# Patient Record
Sex: Male | Born: 1985 | Race: White | Hispanic: No | Marital: Single | Smoking: Never smoker
Health system: Southern US, Community
[De-identification: ages and names within clinical notes are randomized; demographics above are authoritative.]

## PROBLEM LIST (undated history)

## (undated) DIAGNOSIS — E7800 Pure hypercholesterolemia, unspecified: Secondary | ICD-10-CM

## (undated) HISTORY — PX: REFRACTIVE SURGERY: SHX103

---

## 2013-09-14 ENCOUNTER — Other Ambulatory Visit: Payer: Self-pay | Admitting: Internal Medicine

## 2013-09-14 DIAGNOSIS — E010 Iodine-deficiency related diffuse (endemic) goiter: Secondary | ICD-10-CM

## 2013-09-19 ENCOUNTER — Ambulatory Visit
Admission: RE | Admit: 2013-09-19 | Discharge: 2013-09-19 | Disposition: A | Discharge status: Home / Self Care | Payer: BC Managed Care – PPO | Source: Ambulatory Visit | Attending: Internal Medicine | Admitting: Internal Medicine

## 2013-09-19 DIAGNOSIS — E010 Iodine-deficiency related diffuse (endemic) goiter: Secondary | ICD-10-CM

## 2014-11-29 IMAGING — US US SOFT TISSUE HEAD/NECK
1 series · 14 of 25 positions shown · non-contrast
Comparison: None.

CLINICAL DATA: Thyromegaly.

EXAM:
THYROID ULTRASOUND
TECHNIQUE: Ultrasound examination of the thyroid gland and adjacent soft
tissues was performed.

[Series 1: us soft tissue head/neck · 0.08mm/px · 14 of 28 slices shown]
[im 1/28]
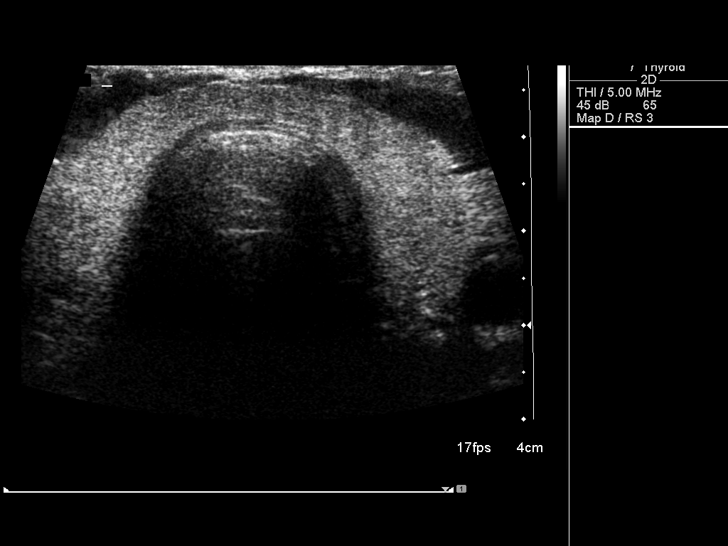
[im 3/28]
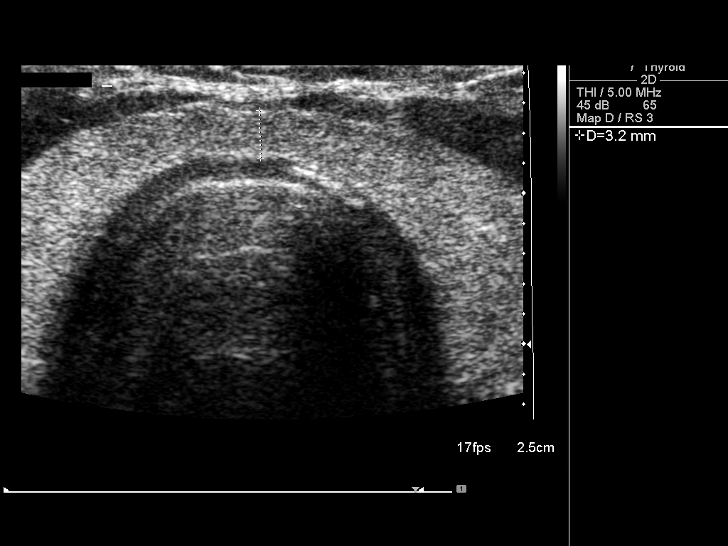
[im 5/28]
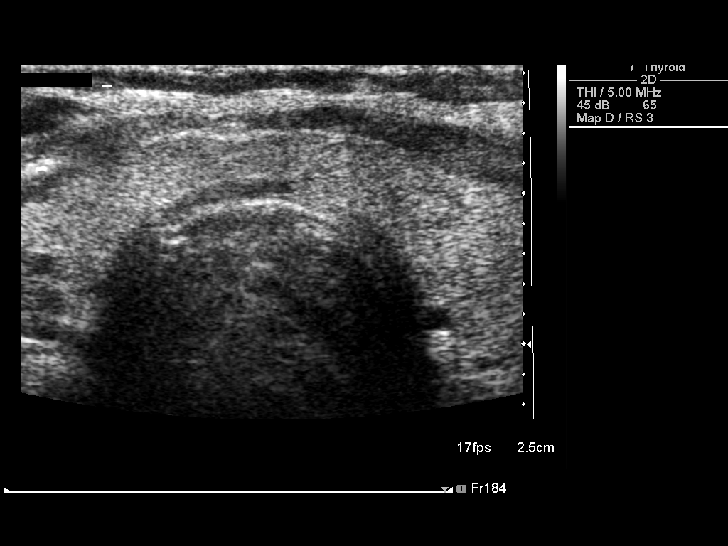
[im 7/28]
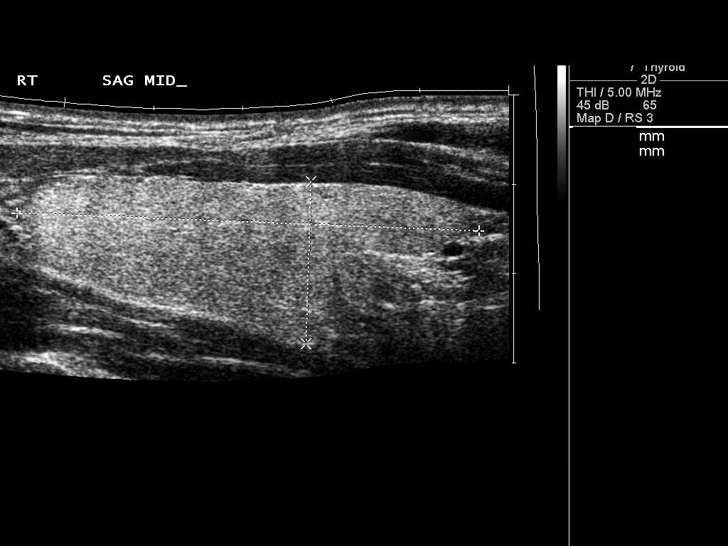
[im 10/28]
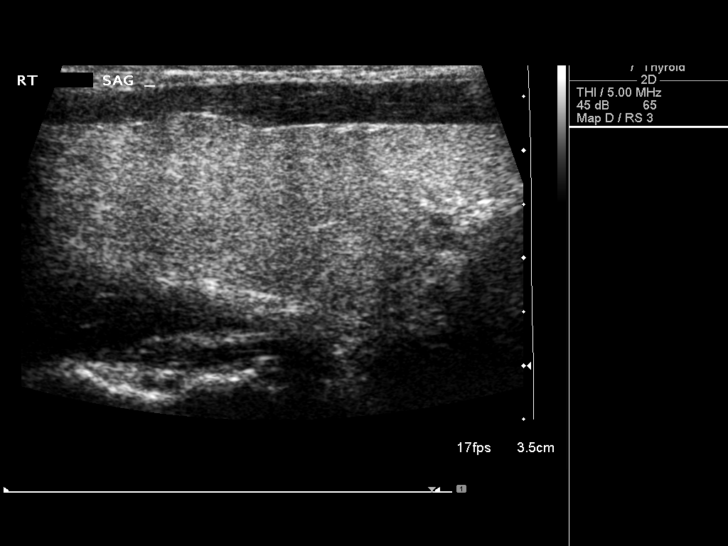
[im 11/28]
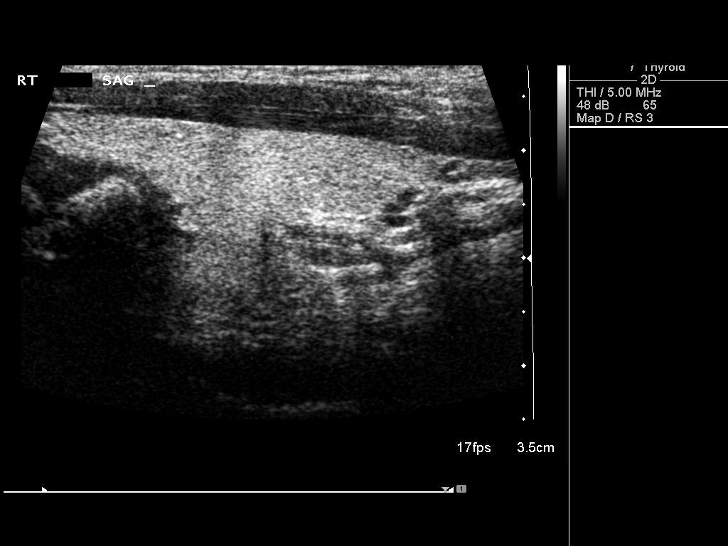
[im 13/28]
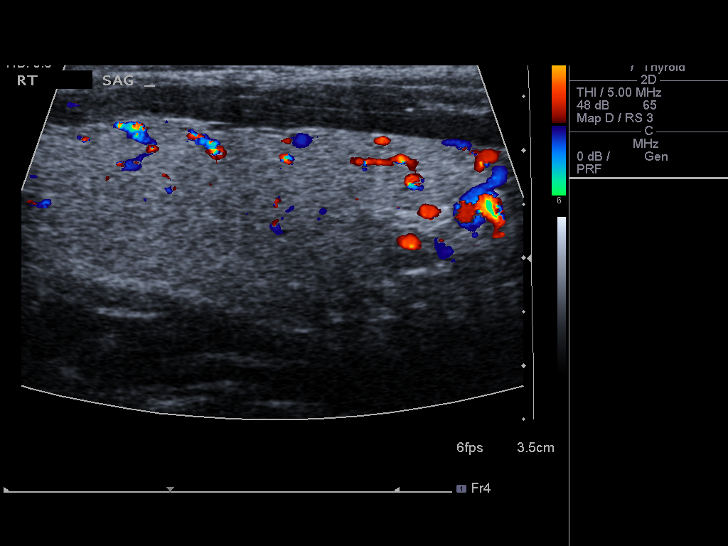
[im 15/28]
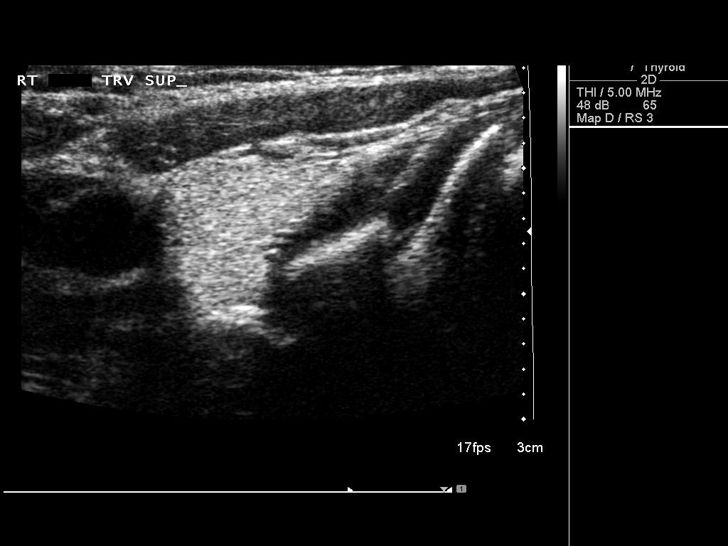
[im 17/28]
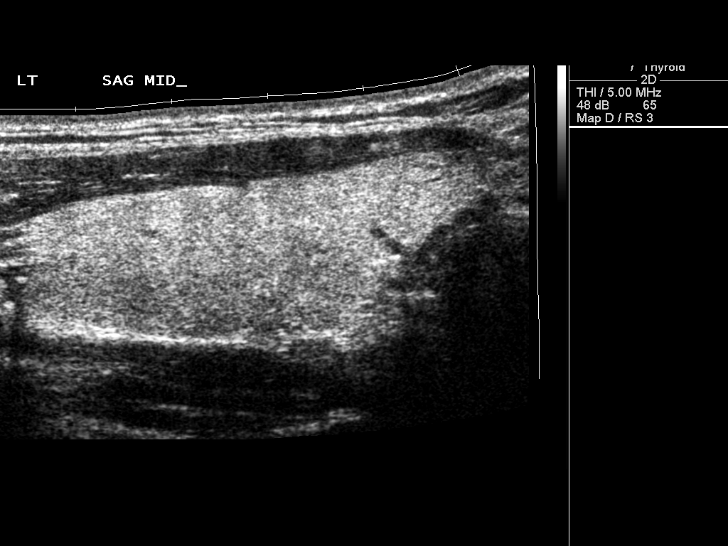
[im 19/28]
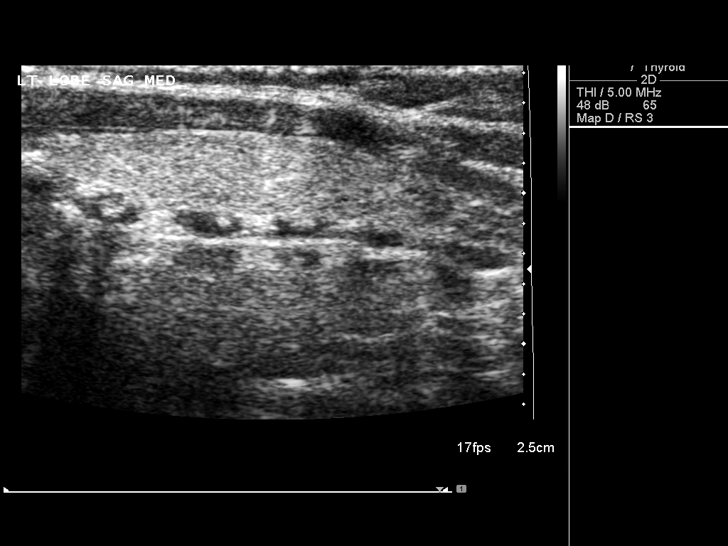
[im 21/28]
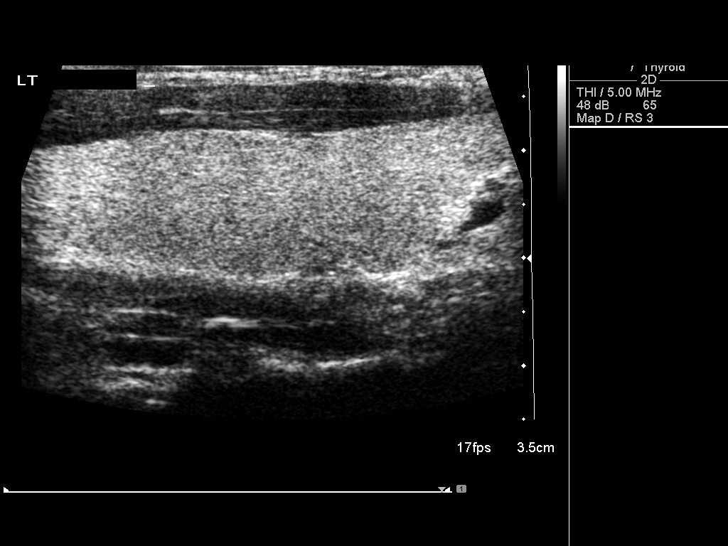
[im 23/28]
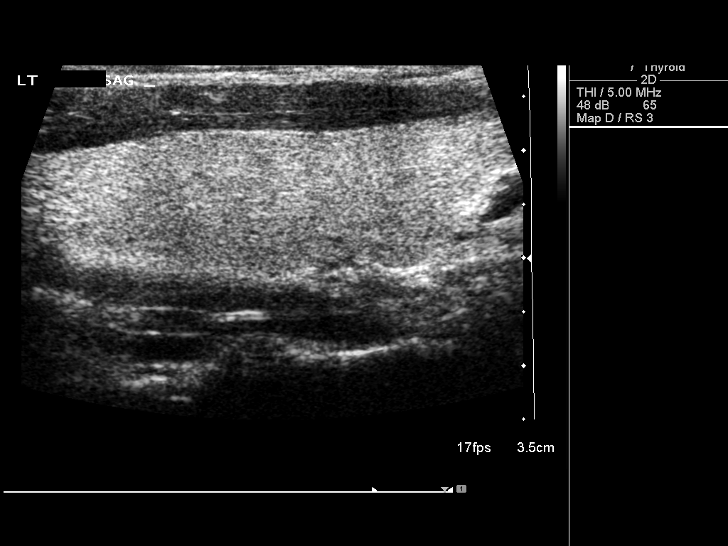
[im 25/28]
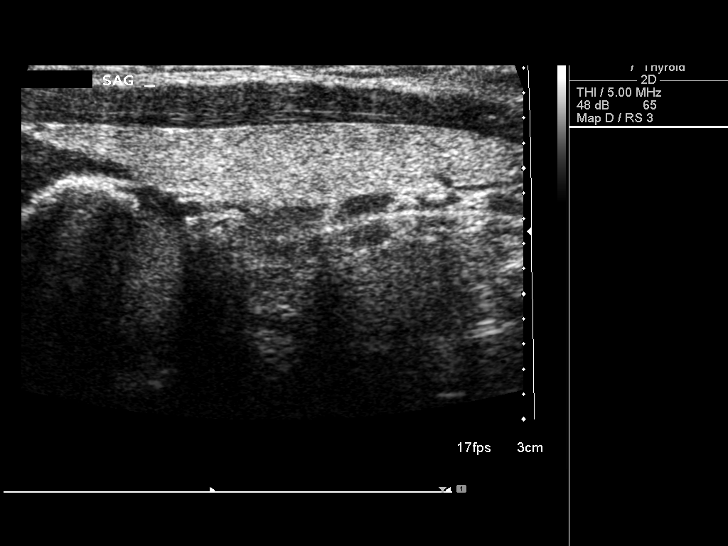
[im 28/28]
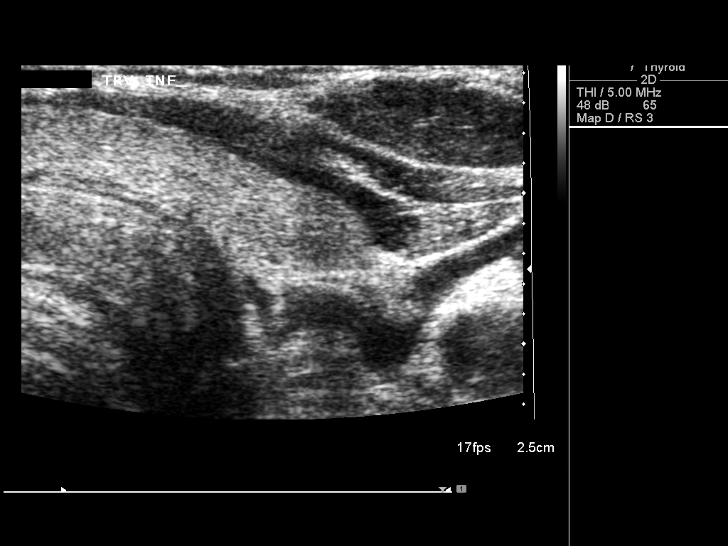

[14 of 25 positions shown; findings below may reference images not displayed]

FINDINGS: Right thyroid lobe

Measurements: 5.2 x 1.8 x 2.3 cm. The parenchyma is homogeneous. No
nodules visualized.

Left thyroid lobe

Measurements: 5.3 x 1.7 x 1.8 cm. The parenchyma is homogeneous. No
nodules visualized.

Isthmus

Thickness: 3 mm.  No nodules visualized.

Lymphadenopathy

None visualized.
IMPRESSION: Normal appearance of the thyroid without focal nodule.

## 2016-05-03 DIAGNOSIS — J309 Allergic rhinitis, unspecified: Secondary | ICD-10-CM | POA: Insufficient documentation

## 2016-05-03 DIAGNOSIS — K5900 Constipation, unspecified: Secondary | ICD-10-CM | POA: Insufficient documentation

## 2020-04-25 ENCOUNTER — Encounter (HOSPITAL_COMMUNITY): Payer: Self-pay | Admitting: Emergency Medicine

## 2020-04-25 ENCOUNTER — Other Ambulatory Visit: Payer: Self-pay

## 2020-04-25 ENCOUNTER — Emergency Department (HOSPITAL_COMMUNITY): Payer: 59

## 2020-04-25 ENCOUNTER — Emergency Department (HOSPITAL_COMMUNITY)
Admission: EM | Admit: 2020-04-25 | Discharge: 2020-04-25 | Disposition: A | Discharge status: Home / Self Care | Payer: 59 | Attending: Emergency Medicine | Admitting: Emergency Medicine

## 2020-04-25 DIAGNOSIS — W208XXA Other cause of strike by thrown, projected or falling object, initial encounter: Secondary | ICD-10-CM | POA: Insufficient documentation

## 2020-04-25 DIAGNOSIS — S91219A Laceration without foreign body of unspecified toe(s) with damage to nail, initial encounter: Secondary | ICD-10-CM | POA: Diagnosis not present

## 2020-04-25 DIAGNOSIS — Y998 Other external cause status: Secondary | ICD-10-CM | POA: Insufficient documentation

## 2020-04-25 DIAGNOSIS — S91209A Unspecified open wound of unspecified toe(s) with damage to nail, initial encounter: Secondary | ICD-10-CM | POA: Insufficient documentation

## 2020-04-25 DIAGNOSIS — Z7722 Contact with and (suspected) exposure to environmental tobacco smoke (acute) (chronic): Secondary | ICD-10-CM | POA: Diagnosis not present

## 2020-04-25 DIAGNOSIS — Y9389 Activity, other specified: Secondary | ICD-10-CM | POA: Insufficient documentation

## 2020-04-25 DIAGNOSIS — Y9289 Other specified places as the place of occurrence of the external cause: Secondary | ICD-10-CM | POA: Insufficient documentation

## 2020-04-25 DIAGNOSIS — S90931A Unspecified superficial injury of right great toe, initial encounter: Secondary | ICD-10-CM | POA: Diagnosis present

## 2020-04-25 HISTORY — DX: Pure hypercholesterolemia, unspecified: E78.00

## 2020-04-25 MED ORDER — BACITRACIN ZINC 500 UNIT/GM EX OINT
1.0000 "application " | TOPICAL_OINTMENT | Freq: Two times a day (BID) | CUTANEOUS | Status: DC
  Administered 2020-04-25: 1 via TOPICAL
  Filled 2020-04-25: qty 0.9

## 2020-04-25 MED ORDER — TETANUS-DIPHTH-ACELL PERTUSSIS 5-2.5-18.5 LF-MCG/0.5 IM SUSP
0.5000 mL | Freq: Once | INTRAMUSCULAR | Status: AC
  Administered 2020-04-25: 11:00:00 0.5 mL via INTRAMUSCULAR
  Filled 2020-04-25: qty 0.5

## 2020-04-25 MED ORDER — LIDOCAINE HCL (PF) 2 % IJ SOLN: 5.0000 mL | Freq: Once | INTRAMUSCULAR | Status: DC

## 2020-04-25 MED ORDER — CEPHALEXIN 500 MG PO CAPS
500.0000 mg | ORAL_CAPSULE | Freq: Four times a day (QID) | ORAL | 0 refills | Status: DC
Start: 2020-04-25 — End: 2023-02-10

## 2020-04-25 MED ORDER — LIDOCAINE HCL (PF) 2 % IJ SOLN
5.0000 mL | Freq: Once | INTRAMUSCULAR | Status: DC
  Filled 2020-04-25: qty 10

## 2020-04-25 MED ORDER — OXYCODONE-ACETAMINOPHEN 5-325 MG PO TABS
1.0000 | ORAL_TABLET | Freq: Once | ORAL | Status: AC
  Administered 2020-04-25: 11:00:00 1 via ORAL
  Filled 2020-04-25: qty 1

## 2020-04-25 NOTE — ED Triage Notes (Signed)
Table fell on right great toe.  Toe nail is gone.

## 2020-04-25 NOTE — ED Notes (Signed)
Patient soaking foot in betadine soak.

## 2020-04-25 NOTE — Discharge Instructions (Signed)
Your x-ray today does not show a broken bone in your toe.  You do have a laceration of the nailbed this was repaired with sutures.  Sutures will dissolve.  Keep the area clean with mild soap and water and keep it bandaged with a nonstick bandages.  Elevate your foot as much as possible and minimize walking or standing.  Return to the emergency department if you develop any worsening symptoms or signs of infection such as fever, chills, redness or red streaking from your toe.  Take ibuprofen, 800 mg with food 3 times a day.

## 2020-04-25 NOTE — Clinical Social Work Note (Signed)
Transition of Care [facility]) - Emergency Department Mini Assessment  Patient Details  Name: Laird Runnion MRN: [id number] Date of Birth: [birth year]  Transition of Care [facility]) CM/SW Contact:    Ewing Schlein, LCSW Phone Number: 04/25/2020, 12:19 PM  Clinical Narrative: Patient is a 33 year old male who presented to the ED for a right toe injury. Per chart review, patient does not currently have a PCP. CSW spoke with patient regarding PCP resources. Patient reported he has a PCP, [facility] in [place], but is interested in a PCP closer to home. CSW provided patient with PCP resource list that accept [facility] insurance. TOC signing off.  ED Mini Assessment: What brought you to the Emergency Department? : Right toe injury Barriers to Discharge: ED Barriers Resolved Barrier interventions: PCP resource list provided Means of departure: Car Interventions which prevented an admission or readmission: Other (must enter comment) (PCP resource list)  Admission diagnosis:  right toe injury There are no problems to display for this patient.  PCP:  Patient, No Pcp Per Pharmacy:   [id number] - [place], [place] - [address] [place] [address] Phone: [phone] Fax: [phone]

## 2020-04-27 NOTE — ED Provider Notes (Signed)
[facility] EMERGENCY DEPARTMENT Provider Note   CSN: [id number] Arrival date & time: 04/25/20  1007     History No chief complaint on file.   Douglas Larson is a 34 y.o. male.  HPI      Douglas Larson is a 34 y.o. male who presents to the Emergency Department complaining of pain and crush injury of the right great toe.  He states that a 150 pound wooden table top fell onto his right great toe causing the avulsion of his toenail.  The nail was partially attached and he completely removed it using a pair of scissors.  He complains of sharp pain to the distal toe and persistent bleeding.  He does not take blood thinners.  No injury of the proximal foot or remaining toes.  Last Td is unknown.    Past Medical History:  Diagnosis Date  . High cholesterol     There are no problems to display for this patient.   History reviewed. No pertinent surgical history.     History reviewed. No pertinent family history.  Social History   Tobacco Use  . Smoking status: Passive Smoke Exposure - Never Smoker  . Smokeless tobacco: Never Used  Vaping Use  . Vaping Use: Never used  Substance Use Topics  . Alcohol use: Not Currently  . Drug use: Never    Home Medications Prior to Admission medications   Medication Sig Start Date End Date Taking? Authorizing Provider  cephALEXin (KEFLEX) 500 MG capsule Take 1 capsule (500 mg total) by mouth 4 (four) times daily. 04/25/20   Tarina Volk, PA-C    Allergies    Prednisone  Review of Systems   Review of Systems  Constitutional: Negative for chills and fever.  Musculoskeletal: Positive for arthralgias (pain right great toe). Negative for joint swelling.  Skin: Positive for wound.       Right great toe crush injury  Neurological: Negative for weakness and numbness.  Hematological: Does not bruise/bleed easily.    Physical Exam Updated Vital Signs BP 110/79 (BP Location: Left Arm)   Pulse 58   Temp 97.6 F (36.4 C) (Oral)    Resp 18   Ht 5\' 11"  (1.803 m)   Wt (!) 97.5 kg   SpO2 100%   BMI 29.99 kg/m   Physical Exam Vitals and nursing note reviewed.  Constitutional:      General: He is not in acute distress.    Appearance: Normal appearance. He is well-developed.  HENT:     Head: Atraumatic.  Cardiovascular:     Rate and Rhythm: Normal rate and regular rhythm.     Pulses: Normal pulses.  Pulmonary:     Effort: Pulmonary effort is normal. No respiratory distress.  Musculoskeletal:        General: Tenderness and signs of injury present. No swelling.     Comments: Complete avulsion of the nail of the right great toe. Nail bed appears macerated, mild to moderate bleeding.  No FB's or obvious open fracture.  Right foot and remaining toes are non tender.   Skin:    General: Skin is warm.     Capillary Refill: Capillary refill takes less than 2 seconds.  Neurological:     Mental Status: He is alert and oriented to person, place, and time.     Motor: No abnormal muscle tone.     Coordination: Coordination normal.     ED Results / Procedures / Treatments   Labs (all labs ordered are  listed, but only abnormal results are displayed) Labs Reviewed - No data to display  EKG None  Radiology DG Toe Great Right  Result Date: 04/25/2020 CLINICAL DATA:  Status post trauma. EXAM: RIGHT GREAT TOE COMPARISON:  None. FINDINGS: There is no evidence of an acute fracture or dislocation. There is no evidence of arthropathy or other focal bone abnormality. Mild soft tissue swelling is seen along the medial aspect of the interphalangeal joint of the right great toe. IMPRESSION: Mild soft tissue swelling without evidence of acute fracture. Electronically Signed   By: Aram Candela M.D.   On: 04/25/2020 11:13     Procedures Procedures (including critical care time)  LACERATION REPAIR Performed by: Derhonda Eastlick Authorized by: Brandom Kerwin Consent: Verbal consent obtained. Risks and benefits: risks, benefits  and alternatives were discussed Consent given by: patient Patient identity confirmed: provided demographic data Prepped and Draped in normal sterile fashion Wound explored  Laceration Location: right great toe nail bed  Laceration Length: 1 cm  No Foreign Bodies seen or palpated  Anesthesia: local infiltration  Local anesthetic: lidocaine 2 % w/o epinephrine  Anesthetic total: 2 ml  Irrigation method: syringe Amount of cleaning: standard  Skin closure: 5-0 vicryl  Number of sutures: 3  Technique: simple interrupted  Patient tolerance: Patient tolerated the procedure well with no immediate complications.   Medications Ordered in ED Medications  Tdap (BOOSTRIX) injection 0.5 mL (0.5 mLs Intramuscular Given 04/25/20 1113)  oxyCODONE-acetaminophen (PERCOCET/ROXICET) 5-325 MG per tablet 1 tablet (1 tablet Oral Given 04/25/20 1113)    ED Course  I have reviewed the triage vital signs and the nursing notes.  Pertinent labs & imaging results that were available during my care of the patient were reviewed by me and considered in my medical decision making (see chart for details).    MDM Rules/Calculators/A&P                          Crush injury of the right great toe,  NV intact.  XR neg for fx.  Nail bed appears macerated and mild bleeding noted.  Wound cleaned with saline and betadine, explored.  hemostasis obtained using absorbable sutures  TD updated, non-stick dressing applied.  rx given for abx and pt agrees to close orthopedic f/u.  Wound care instructions given  Final Clinical Impression(s) / ED Diagnoses Final diagnoses:  Avulsion of toenail, initial encounter  Laceration of nail bed of toe, initial encounter    Rx / DC Orders ED Discharge Orders         Ordered    cephALEXin (KEFLEX) 500 MG capsule  4 times daily     Discontinue  Reprint     04/25/20 1343           Pauline Aus, PA-C 04/27/20 1438    Long, Arlyss Repress, MD 05/02/20 [phone]

## 2021-07-05 IMAGING — DX DG TOE GREAT 2+V*R*
3 series · 3 of 3 positions shown · non-contrast
Comparison: None.

CLINICAL DATA: Status post trauma.

EXAM:
RIGHT GREAT TOE

[toe ap]
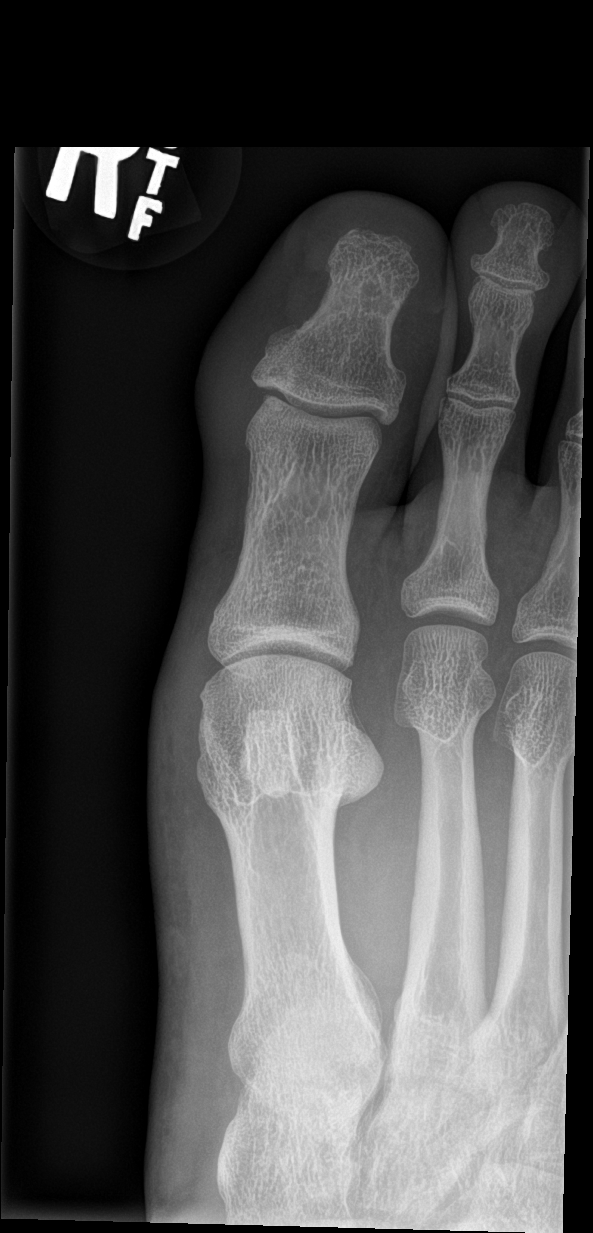

[toe obl]
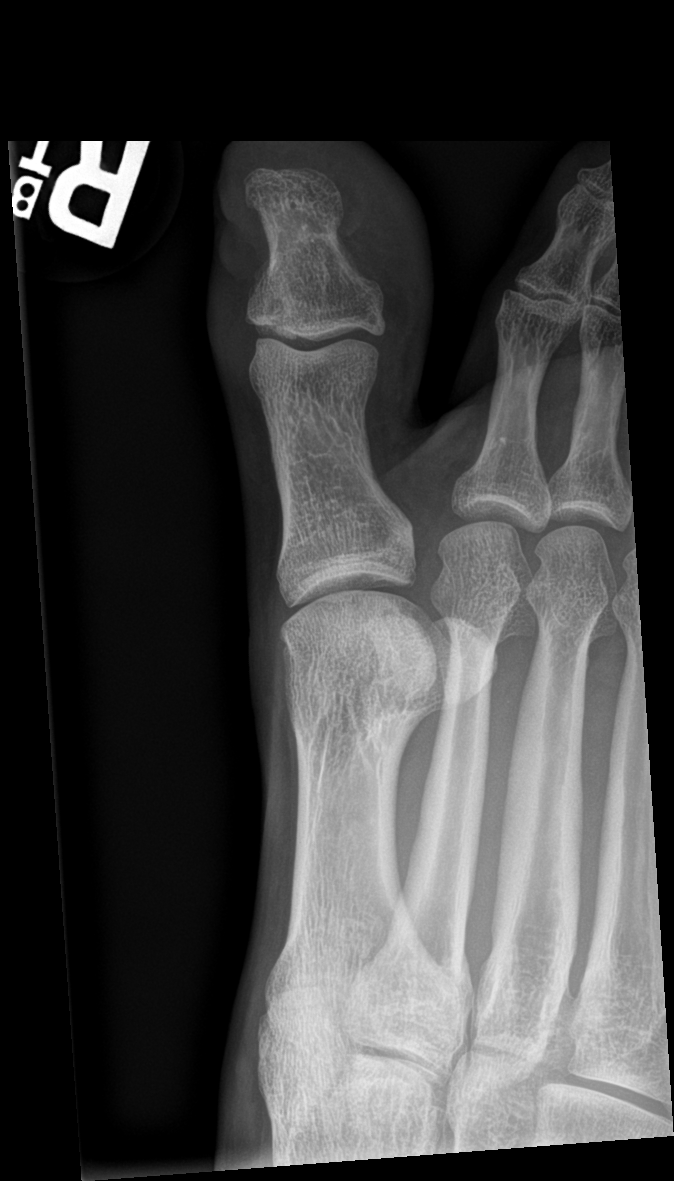

[toe lat]
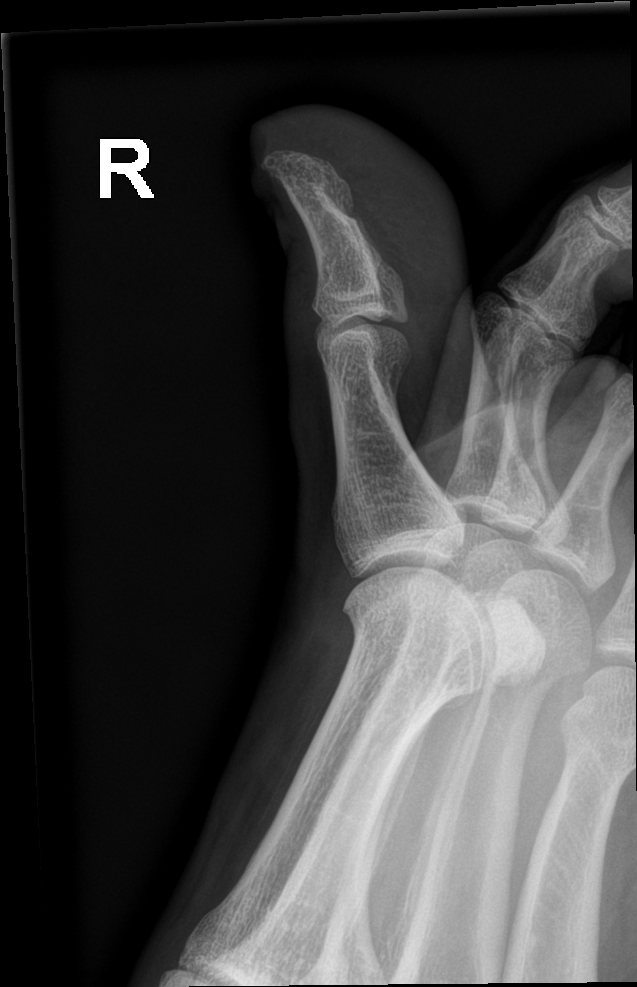

[3 of 3 positions shown; findings below may reference images not displayed]

FINDINGS: There is no evidence of an acute fracture or dislocation. There is
no evidence of arthropathy or other focal bone abnormality. Mild
soft tissue swelling is seen along the medial aspect of the
interphalangeal joint of the right great toe.
IMPRESSION: Mild soft tissue swelling without evidence of acute fracture.

## 2022-03-04 ENCOUNTER — Encounter: Payer: Self-pay | Admitting: Emergency Medicine

## 2022-03-04 ENCOUNTER — Ambulatory Visit
Admission: EM | Admit: 2022-03-04 | Discharge: 2022-03-04 | Disposition: A | Discharge status: Home / Self Care | Payer: 59 | Attending: Family Medicine | Admitting: Family Medicine

## 2022-03-04 DIAGNOSIS — H6121 Impacted cerumen, right ear: Secondary | ICD-10-CM

## 2022-03-04 DIAGNOSIS — H9201 Otalgia, right ear: Secondary | ICD-10-CM | POA: Diagnosis not present

## 2022-03-04 MED ORDER — AMOXICILLIN 875 MG PO TABS: 875.0000 mg | ORAL_TABLET | Freq: Two times a day (BID) | ORAL | 0 refills | Status: DC

## 2022-03-04 NOTE — ED Triage Notes (Signed)
Right ear pain since Sunday.  Has been taking zyrtec and Flonase without relief.

## 2022-03-04 NOTE — Discharge Instructions (Addendum)
Try Debrox drops twice daily to help with wax softening.  You may try Flonase and Sudafed to help with inner ear pressure.

## 2022-03-04 NOTE — ED Provider Notes (Signed)
[facility]    CSN: [id number] Arrival date & time: 03/04/22  1639      History   Chief Complaint No chief complaint on file.   HPI Douglas Larson is a 36 y.o. male.   Presenting today with 4-day history of progressively worsening sharp stabbing constant right ear pain, now with pain radiating down toward his throat when he swallows from the pressure.  Denies fever, chills, drainage, headache, cough, chest pain, shortness of breath.  Taking Zyrtec, Flonase and trying to keep his ears clean of wax with no relief.  He just cleaned his ears out several days ago but it did not help.   Past Medical History:  Diagnosis Date   High cholesterol     There are no problems to display for this patient.   History reviewed. No pertinent surgical history.     Home Medications    Prior to Admission medications   Medication Sig Start Date End Date Taking? Authorizing Provider  amoxicillin (AMOXIL) 875 MG tablet Take 1 tablet (875 mg total) by mouth 2 (two) times daily. 03/04/22  Yes [name], PA-C  cephALEXin (KEFLEX) 500 MG capsule Take 1 capsule (500 mg total) by mouth 4 (four) times daily. 04/25/20   Pauline Aus, PA-C    Family History History reviewed. No pertinent family history.  Social History Social History   Tobacco Use   Smoking status: Passive Smoke Exposure - Never Smoker   Smokeless tobacco: Never  Vaping Use   Vaping Use: Never used  Substance Use Topics   Alcohol use: Not Currently   Drug use: Never     Allergies   Prednisone   Review of Systems Review of Systems Per HPI  Physical Exam Triage Vital Signs ED Triage Vitals  Enc Vitals Group     BP 03/04/22 1644 134/74     Pulse Rate 03/04/22 1644 83     Resp 03/04/22 1644 18     Temp 03/04/22 1644 97.6 F (36.4 C)     Temp Source 03/04/22 1644 Oral     SpO2 03/04/22 1644 97 %     Weight --      Height --      Head Circumference --      Peak Flow --      Pain Score  03/04/22 1647 4     Pain Loc --      Pain Edu? --      Excl. in GC? --    No data found.  Updated Vital Signs BP 134/74 (BP Location: Right Arm)   Pulse 83   Temp 97.6 F (36.4 C) (Oral)   Resp 18   SpO2 97%   Visual Acuity Right Eye Distance:   Left Eye Distance:   Bilateral Distance:    Right Eye Near:   Left Eye Near:    Bilateral Near:     Physical Exam Vitals and nursing note reviewed.  Constitutional:      Appearance: Normal appearance.  HENT:     Head: Atraumatic.     Right Ear: There is impacted cerumen.     Left Ear: Tympanic membrane and external ear normal.     Nose: Nose normal.     Mouth/Throat:     Mouth: Mucous membranes are moist.     Pharynx: Oropharynx is clear.  Eyes:     Extraocular Movements: Extraocular movements intact.     Conjunctiva/sclera: Conjunctivae normal.  Cardiovascular:     Rate  and Rhythm: Normal rate and regular rhythm.  Pulmonary:     Effort: Pulmonary effort is normal.     Breath sounds: Normal breath sounds.  Musculoskeletal:        General: Normal range of motion.     Cervical back: Normal range of motion and neck supple.  Skin:    General: Skin is warm and dry.  Neurological:     General: No focal deficit present.     Mental Status: He is oriented to person, place, and time.  Psychiatric:        Mood and Affect: Mood normal.        Thought Content: Thought content normal.        Judgment: Judgment normal.     UC Treatments / Results  Labs (all labs ordered are listed, but only abnormal results are displayed) Labs Reviewed - No data to display  EKG   Radiology No results found.  Procedures Procedures (including critical care time)  Medications Ordered in UC Medications - No data to display  Initial Impression / Assessment and Plan / UC Course  I have reviewed the triage vital signs and the nursing notes.  Pertinent labs & imaging results that were available during my care of the patient were  reviewed by me and considered in my medical decision making (see chart for details).     Unable to visualize right TM due to full cerumen impaction.  Unclear at this time if his sharp stabbing pain is due to an inner ear infection that is unable to be visualized or due to the cerumen being pressed down against his TM.  Given the risk of possible perforation will avoid lavage today, treat in case middle ear infection and continue Debrox drops.  Flonase and Sudafed for inner ear pressure.  Return for worsening symptoms.  Final Clinical Impressions(s) / UC Diagnoses   Final diagnoses:  Right ear pain  Impacted cerumen of right ear     Discharge Instructions      Try Debrox drops twice daily to help with wax softening.  You may try Flonase and Sudafed to help with inner ear pressure.    ED Prescriptions     Medication Sig Dispense Auth. Provider   amoxicillin (AMOXIL) 875 MG tablet Take 1 tablet (875 mg total) by mouth 2 (two) times daily. 20 tablet [name], [place]      PDMP not reviewed this encounter.   [name], [place] 03/04/22 1721

## 2022-07-28 ENCOUNTER — Ambulatory Visit
Admission: EM | Admit: 2022-07-28 | Discharge: 2022-07-28 | Disposition: A | Discharge status: Home / Self Care | Payer: 59 | Attending: Nurse Practitioner | Admitting: Nurse Practitioner

## 2022-07-28 DIAGNOSIS — J0100 Acute maxillary sinusitis, unspecified: Secondary | ICD-10-CM

## 2022-07-28 MED ORDER — PSEUDOEPH-BROMPHEN-DM 30-2-10 MG/5ML PO SYRP: 5.0000 mL | ORAL_SOLUTION | Freq: Four times a day (QID) | ORAL | 0 refills | Status: DC | PRN

## 2022-07-28 MED ORDER — DOXYCYCLINE HYCLATE 100 MG PO TABS: 100.0000 mg | ORAL_TABLET | Freq: Two times a day (BID) | ORAL | 0 refills | Status: DC

## 2022-07-28 NOTE — ED Provider Notes (Signed)
[facility]    CSN: [id number] Arrival date & time: 07/28/22  1512      History   Chief Complaint No chief complaint on file.   HPI Douglas Larson is a 36 y.o. male.   The history is provided by the patient.   Patient with a several week history of nasal congestion, headache, runny nose, cough, sore throat, and nasal drainage.  Patient was treated for an acute sinusitis on 10/3 with Augmentin.  He states initially his symptoms did seem to improve but over the last week, he developed fever and worsening symptoms.  He has not had a fever over the last several days.  He states today he is beginning to feel better.  He states that his doctor did also inform him to begin taking antihistamine such as Zyrtec and Allegra.  He states he has been also using Flonase and Sudafed, and an over-the-counter cough medication.   Past Medical History:  Diagnosis Date   High cholesterol     There are no problems to display for this patient.   History reviewed. No pertinent surgical history.     Home Medications    Prior to Admission medications   Medication Sig Start Date End Date Taking? Authorizing Provider  brompheniramine-pseudoephedrine-DM 30-2-10 MG/5ML syrup Take 5 mLs by mouth 4 (four) times daily as needed. 07/28/22  Yes Kerolos Nehme-Warren, [name], NP  doxycycline (VIBRA-TABS) 100 MG tablet Take 1 tablet (100 mg total) by mouth 2 (two) times daily. 08/04/22  Yes Markie Heffernan-Warren, [name], NP  amoxicillin (AMOXIL) 875 MG tablet Take 1 tablet (875 mg total) by mouth 2 (two) times daily. 03/04/22   [name], PA-C  cephALEXin (KEFLEX) 500 MG capsule Take 1 capsule (500 mg total) by mouth 4 (four) times daily. 04/25/20   Pauline Aus, PA-C    Family History History reviewed. No pertinent family history.  Social History Social History   Tobacco Use   Smoking status: Never    Passive exposure: Yes   Smokeless tobacco: Never  Vaping Use   Vaping Use: Never  used  Substance Use Topics   Alcohol use: Not Currently   Drug use: Never     Allergies   Prednisone   Review of Systems Review of Systems Per HPI  Physical Exam Triage Vital Signs ED Triage Vitals  Enc Vitals Group     BP 07/28/22 1557 118/79     Pulse Rate 07/28/22 1557 86     Resp 07/28/22 1557 18     Temp 07/28/22 1557 97.9 F (36.6 C)     Temp Source 07/28/22 1557 Oral     SpO2 07/28/22 1557 97 %     Weight --      Height --      Head Circumference --      Peak Flow --      Pain Score 07/28/22 1602 0     Pain Loc --      Pain Edu? --      Excl. in GC? --    No data found.  Updated Vital Signs BP 118/79 (BP Location: Right Arm)   Pulse 86   Temp 97.9 F (36.6 C) (Oral)   Resp 18   SpO2 97%   Visual Acuity Right Eye Distance:   Left Eye Distance:   Bilateral Distance:    Right Eye Near:   Left Eye Near:    Bilateral Near:     Physical Exam Vitals and nursing note  reviewed.  Constitutional:      General: He is not in acute distress.    Appearance: Normal appearance.  HENT:     Head: Normocephalic.     Right Ear: Tympanic membrane, ear canal and external ear normal.     Left Ear: Tympanic membrane, ear canal and external ear normal.     Nose: Congestion present. No rhinorrhea.     Right Turbinates: Enlarged and swollen.     Left Turbinates: Enlarged and swollen.     Right Sinus: No maxillary sinus tenderness or frontal sinus tenderness.     Left Sinus: Maxillary sinus tenderness present. No frontal sinus tenderness.     Mouth/Throat:     Mouth: Mucous membranes are moist.     Pharynx: Posterior oropharyngeal erythema present.  Eyes:     Extraocular Movements: Extraocular movements intact.     Conjunctiva/sclera: Conjunctivae normal.     Pupils: Pupils are equal, round, and reactive to light.  Cardiovascular:     Rate and Rhythm: Normal rate and regular rhythm.     Pulses: Normal pulses.     Heart sounds: Normal heart sounds.  Pulmonary:      Effort: Pulmonary effort is normal. No respiratory distress.     Breath sounds: Normal breath sounds. No stridor. No wheezing, rhonchi or rales.  Abdominal:     General: Bowel sounds are normal.     Palpations: Abdomen is soft.     Tenderness: There is no abdominal tenderness.  Musculoskeletal:     Cervical back: Normal range of motion.  Lymphadenopathy:     Cervical: No cervical adenopathy.  Skin:    General: Skin is warm and dry.  Neurological:     General: No focal deficit present.     Mental Status: He is alert and oriented to person, place, and time.  Psychiatric:        Mood and Affect: Mood normal.        Behavior: Behavior normal.      UC Treatments / Results  Labs (all labs ordered are listed, but only abnormal results are displayed) Labs Reviewed - No data to display  EKG   Radiology No results found.  Procedures Procedures (including critical care time)  Medications Ordered in UC Medications - No data to display  Initial Impression / Assessment and Plan / UC Course  I have reviewed the triage vital signs and the nursing notes.  Pertinent labs & imaging results that were available during my care of the patient were reviewed by me and considered in my medical decision making (see chart for details).  Patient is well-appearing, he is in no acute distress, vital signs are stable.  Suspicion that there was treatment failure with the use of Augmentin for his previous sinusitis.  Differential diagnoses include acute maxillary sinusitis and allergic rhinitis.  Discussed with patient that a watch and wait approach would be beneficial to determine if his symptoms will continue to improve at this time.  Patient was treated with Bromfed cough medicine for the cough and nasal congestion.  Patient was also advised to continue Flonase, Allegra, and Zyrtec that he is currently using for the next 7 days.  Patient was sent a prescription to begin doxycycline on 08/04/2022 if  his symptoms fail to improve.  Patient was given supportive care recommendations.  Patient verbalizes understanding.  All questions were answered.  Patient is stable for discharge. Final Clinical Impressions(s) / UC Diagnoses   Final diagnoses:  Acute maxillary  sinusitis, recurrence not specified     Discharge Instructions      Take medication as prescribed.  Continue the Flonase and allergy medicine you are currently taking while starting this cough medication. Increase fluids and allow for plenty of rest. May take over-the-counter ibuprofen or Tylenol as needed for pain, fever, or general discomfort. Recommend normal saline nasal spray to help with nasal congestion throughout the day. For your cough, it may be helpful to use a humidifier at bedtime during sleep. As discussed, if symptoms do not improve over the next 7 days, a prescription for an antibiotic has been sent to your pharmacy to start on 08/04/2022.  If you complete the antibiotic but symptoms do not improve, please follow-up with your primary care physician. Follow-up as needed.      ED Prescriptions     Medication Sig Dispense Auth. Provider   brompheniramine-pseudoephedrine-DM 30-2-10 MG/5ML syrup Take 5 mLs by mouth 4 (four) times daily as needed. 140 mL Austen Wygant-Warren, [name], NP   doxycycline (VIBRA-TABS) 100 MG tablet Take 1 tablet (100 mg total) by mouth 2 (two) times daily. 20 tablet Odessie Polzin-Warren, [name], NP      PDMP not reviewed this encounter.   Abran Cantor, NP 07/29/22 [phone]

## 2022-07-28 NOTE — ED Triage Notes (Signed)
Pt reports he think he has a sinus infection, congestion, sever headache for a week then  Friday felt really bad, runny nose, cough, sore throat chunky yellow nose drainage. Zyrtec and flonase, allegra,  sudafed, cough  medicine. Is currently feeling lightheaded   Was see 10/3 and they said he had a sinus infection and they gave him an antibiotic.

## 2022-07-28 NOTE — Discharge Instructions (Addendum)
Take medication as prescribed.  Continue the Flonase and allergy medicine you are currently taking while starting this cough medication. Increase fluids and allow for plenty of rest. May take over-the-counter ibuprofen or Tylenol as needed for pain, fever, or general discomfort. Recommend normal saline nasal spray to help with nasal congestion throughout the day. For your cough, it may be helpful to use a humidifier at bedtime during sleep. As discussed, if symptoms do not improve over the next 7 days, a prescription for an antibiotic has been sent to your pharmacy to start on 08/04/2022.  If you complete the antibiotic but symptoms do not improve, please follow-up with your primary care physician. Follow-up as needed.

## 2022-09-16 ENCOUNTER — Ambulatory Visit
Admission: EM | Admit: 2022-09-16 | Discharge: 2022-09-16 | Disposition: A | Discharge status: Home / Self Care | Payer: 59 | Attending: Family Medicine | Admitting: Family Medicine

## 2022-09-16 DIAGNOSIS — J069 Acute upper respiratory infection, unspecified: Secondary | ICD-10-CM | POA: Insufficient documentation

## 2022-09-16 DIAGNOSIS — Z1152 Encounter for screening for COVID-19: Secondary | ICD-10-CM | POA: Insufficient documentation

## 2022-09-16 DIAGNOSIS — R42 Dizziness and giddiness: Secondary | ICD-10-CM | POA: Diagnosis present

## 2022-09-16 DIAGNOSIS — Z20828 Contact with and (suspected) exposure to other viral communicable diseases: Secondary | ICD-10-CM | POA: Insufficient documentation

## 2022-09-16 DIAGNOSIS — E162 Hypoglycemia, unspecified: Secondary | ICD-10-CM | POA: Insufficient documentation

## 2022-09-16 DIAGNOSIS — R059 Cough, unspecified: Secondary | ICD-10-CM | POA: Diagnosis present

## 2022-09-16 LAB — POCT FASTING CBG KUC MANUAL ENTRY
POCT Glucose (KUC): 59 mg/dL — AB (ref 70–99)
POCT Glucose (KUC): 85 mg/dL (ref 70–99)

## 2022-09-16 MED ORDER — PROMETHAZINE-DM 6.25-15 MG/5ML PO SYRP: 5.0000 mL | ORAL_SOLUTION | Freq: Four times a day (QID) | ORAL | 0 refills | Status: DC | PRN

## 2022-09-16 MED ORDER — OSELTAMIVIR PHOSPHATE 75 MG PO CAPS: 75.0000 mg | ORAL_CAPSULE | Freq: Two times a day (BID) | ORAL | 0 refills | Status: DC

## 2022-09-16 NOTE — ED Provider Notes (Signed)
[facility]    CSN: [id number] Arrival date & time: 09/16/22  [id number]      History   Chief Complaint No chief complaint on file.   HPI Douglas Larson is a 36 y.o. male.   Patient presenting today with 1 day history of cough, body aches, chills, sweats, lightheadedness, headaches, sore throat, runny nose.  Denies chest pain, shortness of breath, abdominal pain, vomiting, diarrhea.  States he has not eaten or drink much of anything since symptoms started.  Daughter was diagnosed with flu 2 days ago.  Took a dose of DayQuil yesterday morning but nothing since.    Past Medical History:  Diagnosis Date   High cholesterol     There are no problems to display for this patient.   History reviewed. No pertinent surgical history.     Home Medications    Prior to Admission medications   Medication Sig Start Date End Date Taking? Authorizing Provider  oseltamivir (TAMIFLU) 75 MG capsule Take 1 capsule (75 mg total) by mouth every 12 (twelve) hours. 09/16/22  Yes Volney American, PA-C  promethazine-dextromethorphan (PROMETHAZINE-DM) 6.25-15 MG/5ML syrup Take 5 mLs by mouth 4 (four) times daily as needed. 09/16/22  Yes Volney American, PA-C  amoxicillin (AMOXIL) 875 MG tablet Take 1 tablet (875 mg total) by mouth 2 (two) times daily. 03/04/22   Volney American, PA-C  brompheniramine-pseudoephedrine-DM 30-2-10 MG/5ML syrup Take 5 mLs by mouth 4 (four) times daily as needed. 07/28/22   Leath-Warren, Alda Lea, NP  cephALEXin (KEFLEX) 500 MG capsule Take 1 capsule (500 mg total) by mouth 4 (four) times daily. 04/25/20   Triplett, Tammy, PA-C  doxycycline (VIBRA-TABS) 100 MG tablet Take 1 tablet (100 mg total) by mouth 2 (two) times daily. 08/04/22   Leath-Warren, Alda Lea, NP  loratadine (CLARITIN) 10 MG tablet Take 1 tablet by mouth daily.    [provider]    Family History History reviewed. No pertinent family history.  Social History Social  History   Tobacco Use   Smoking status: Never    Passive exposure: Yes   Smokeless tobacco: Never  Vaping Use   Vaping Use: Never used  Substance Use Topics   Alcohol use: Not Currently   Drug use: Never     Allergies   Prednisone   Review of Systems Review of Systems Per HPI  Physical Exam Triage Vital Signs ED Triage Vitals  Enc Vitals Group     BP 09/16/22 1002 (!) 130/90     Pulse Rate 09/16/22 1002 (!) 102     Resp 09/16/22 1002 (!) 22     Temp 09/16/22 1002 98.6 F (37 C)     Temp Source 09/16/22 1002 Oral     SpO2 09/16/22 1002 99 %     Weight --      Height --      Head Circumference --      Peak Flow --      Pain Score 09/16/22 1131 4     Pain Loc --      Pain Edu? --      Excl. in [place]? --    No data found.  Updated Vital Signs BP (!) 130/99   Pulse (!) 102   Temp 98.6 F (37 C) (Oral)   Resp (!) 22   SpO2 99%   Visual Acuity Right Eye Distance:   Left Eye Distance:   Bilateral Distance:    Right Eye Near:   Left  Eye Near:    Bilateral Near:     Physical Exam Vitals and nursing note reviewed.  Constitutional:      Appearance: He is well-developed.  HENT:     Head: Atraumatic.     Right Ear: External ear normal.     Left Ear: External ear normal.     Nose: Rhinorrhea present.     Mouth/Throat:     Pharynx: Posterior oropharyngeal erythema present. No oropharyngeal exudate.  Eyes:     Conjunctiva/sclera: Conjunctivae normal.     Pupils: Pupils are equal, round, and reactive to light.  Cardiovascular:     Rate and Rhythm: Normal rate and regular rhythm.  Pulmonary:     Effort: Pulmonary effort is normal. No respiratory distress.     Breath sounds: No wheezing or rales.  Musculoskeletal:        General: Normal range of motion.     Cervical back: Normal range of motion and neck supple.  Lymphadenopathy:     Cervical: No cervical adenopathy.  Skin:    General: Skin is warm and dry.  Neurological:     Mental Status: He is alert  and oriented to person, place, and time.  Psychiatric:        Behavior: Behavior normal.      UC Treatments / Results  Labs (all labs ordered are listed, but only abnormal results are displayed) Labs Reviewed  POCT FASTING CBG [facility] MANUAL ENTRY - Abnormal; Notable for the following components:      Result Value   POCT Glucose ([facility]) 59 (*)    All other components within normal limits  RESP PANEL BY RT-PCR (FLU A&B, COVID) [id number]    EKG   Radiology No results found.  Procedures Procedures (including critical care time)  Medications Ordered in UC Medications - No data to display  Initial Impression / Assessment and Plan / UC Course  I have reviewed the triage vital signs and the nursing notes.  Pertinent labs & imaging results that were available during my care of the patient were reviewed by me and considered in my medical decision making (see chart for details).     Patient was feeling weak and nauseous in triage so blood sugar was checked and was 59 fasting, patient states he has not eaten since yesterday afternoon so snack and soda was given and he reports feeling much better after eating something.  He is overall stable and in no acute distress.  Suspect influenza, confirmation testing pending but will start Tamiflu while awaiting given known exposure and consistent symptoms.  Phenergan DM, DayQuil, NyQuil and other supportive medications reviewed.  Work note given.  Final Clinical Impressions(s) / UC Diagnoses   Final diagnoses:  Viral URI with cough  Exposure to influenza  Hypoglycemia     Discharge Instructions      I suspect that you have the flu.  We have started you on the antiviral medication today while we wait for your confirmation test as well as a good cough syrup.  You may take over-the-counter cold and congestion medications, ibuprofen and Tylenol and make sure to stay very well-hydrated with plain water and an electrolyte drink such as Gatorade or  Pedialyte even if you do not feel up to eating.    ED Prescriptions     Medication Sig Dispense Auth. Provider   oseltamivir (TAMIFLU) 75 MG capsule Take 1 capsule (75 mg total) by mouth every 12 (twelve) hours. 10 capsule [name], [place]  promethazine-dextromethorphan (PROMETHAZINE-DM) 6.25-15 MG/5ML syrup Take 5 mLs by mouth 4 (four) times daily as needed. 100 mL Volney American, [place]      PDMP not reviewed this encounter.   Volney American, [place] 09/16/22 1233

## 2022-09-16 NOTE — ED Triage Notes (Signed)
Pt reports x 1 day dizzness, feeling cold since this morning, headaches, cough, throat, runny nose, feels like he can't catch his breath.  Reports no chest pain no fever. He start shaking really bad.

## 2022-09-16 NOTE — Discharge Instructions (Signed)
I suspect that you have the flu.  We have started you on the antiviral medication today while we wait for your confirmation test as well as a good cough syrup.  You may take over-the-counter cold and congestion medications, ibuprofen and Tylenol and make sure to stay very well-hydrated with plain water and an electrolyte drink such as Gatorade or Pedialyte even if you do not feel up to eating.

## 2022-09-17 LAB — RESP PANEL BY RT-PCR (FLU A&B, COVID) ARPGX2
Influenza A by PCR: POSITIVE — AB
Influenza B by PCR: NEGATIVE
SARS Coronavirus 2 by RT PCR: NEGATIVE

## 2023-02-09 ENCOUNTER — Other Ambulatory Visit: Payer: Self-pay

## 2023-02-09 ENCOUNTER — Emergency Department (HOSPITAL_COMMUNITY): Payer: 59

## 2023-02-09 ENCOUNTER — Encounter (HOSPITAL_COMMUNITY): Payer: Self-pay

## 2023-02-09 ENCOUNTER — Inpatient Hospital Stay (HOSPITAL_COMMUNITY)
Admission: EM | Admit: 2023-02-09 | Discharge: 2023-02-11 | DRG: 603 | Disposition: A | Discharge status: Home / Self Care | Payer: 59 | Attending: Internal Medicine | Admitting: Internal Medicine

## 2023-02-09 DIAGNOSIS — T361X5A Adverse effect of cephalosporins and other beta-lactam antibiotics, initial encounter: Secondary | ICD-10-CM | POA: Diagnosis not present

## 2023-02-09 DIAGNOSIS — L03012 Cellulitis of left finger: Secondary | ICD-10-CM

## 2023-02-09 DIAGNOSIS — L03113 Cellulitis of right upper limb: Secondary | ICD-10-CM | POA: Diagnosis not present

## 2023-02-09 DIAGNOSIS — M7989 Other specified soft tissue disorders: Secondary | ICD-10-CM | POA: Diagnosis present

## 2023-02-09 DIAGNOSIS — I891 Lymphangitis: Secondary | ICD-10-CM

## 2023-02-09 DIAGNOSIS — L0390 Cellulitis, unspecified: Secondary | ICD-10-CM | POA: Diagnosis present

## 2023-02-09 DIAGNOSIS — L03011 Cellulitis of right finger: Secondary | ICD-10-CM | POA: Diagnosis present

## 2023-02-09 DIAGNOSIS — E7800 Pure hypercholesterolemia, unspecified: Secondary | ICD-10-CM | POA: Diagnosis present

## 2023-02-09 DIAGNOSIS — W57XXXA Bitten or stung by nonvenomous insect and other nonvenomous arthropods, initial encounter: Secondary | ICD-10-CM | POA: Diagnosis present

## 2023-02-09 DIAGNOSIS — Z880 Allergy status to penicillin: Secondary | ICD-10-CM

## 2023-02-09 DIAGNOSIS — Z888 Allergy status to other drugs, medicaments and biological substances status: Secondary | ICD-10-CM

## 2023-02-09 DIAGNOSIS — Z881 Allergy status to other antibiotic agents status: Secondary | ICD-10-CM

## 2023-02-09 LAB — CBC WITH DIFFERENTIAL/PLATELET
Abs Immature Granulocytes: 0.02 10*3/uL (ref 0.00–0.07)
Basophils Absolute: 0.1 10*3/uL (ref 0.0–0.1)
Basophils Relative: 1 %
Eosinophils Absolute: 0.3 10*3/uL (ref 0.0–0.5)
Eosinophils Relative: 3 %
HCT: 47.5 % (ref 39.0–52.0)
Hemoglobin: 16 g/dL (ref 13.0–17.0)
Immature Granulocytes: 0 %
Lymphocytes Relative: 25 %
Lymphs Abs: 2.1 10*3/uL (ref 0.7–4.0)
MCH: 31 pg (ref 26.0–34.0)
MCHC: 33.7 g/dL (ref 30.0–36.0)
MCV: 92.1 fL (ref 80.0–100.0)
Monocytes Absolute: 0.7 10*3/uL (ref 0.1–1.0)
Monocytes Relative: 8 %
Neutro Abs: 5.3 10*3/uL (ref 1.7–7.7)
Neutrophils Relative %: 63 %
Platelets: 184 10*3/uL (ref 150–400)
RBC: 5.16 MIL/uL (ref 4.22–5.81)
RDW: 12.6 % (ref 11.5–15.5)
WBC: 8.4 10*3/uL (ref 4.0–10.5)
nRBC: 0 % (ref 0.0–0.2)

## 2023-02-09 LAB — BASIC METABOLIC PANEL
Anion gap: 8 (ref 5–15)
BUN: 11 mg/dL (ref 6–20)
CO2: 27 mmol/L (ref 22–32)
Calcium: 9.3 mg/dL (ref 8.9–10.3)
Chloride: 103 mmol/L (ref 98–111)
Creatinine, Ser: 1.06 mg/dL (ref 0.61–1.24)
GFR, Estimated: >60 mL/min (ref >60)
Glucose, Bld: 85 mg/dL (ref 70–99)
Potassium: 4.2 mmol/L (ref 3.5–5.1)
Sodium: 138 mmol/L (ref 135–145)

## 2023-02-09 LAB — CULTURE, BLOOD (ROUTINE X 2)

## 2023-02-09 MED ORDER — SODIUM CHLORIDE 0.9 % IV SOLN
2.0000 g | Freq: Once | INTRAVENOUS | Status: AC
  Administered 2023-02-09: 2 g via INTRAVENOUS
  Filled 2023-02-09: qty 20

## 2023-02-09 MED ORDER — IBUPROFEN 800 MG PO TABS
800.0000 mg | ORAL_TABLET | Freq: Once | ORAL | Status: AC
  Administered 2023-02-09: 800 mg via ORAL
  Filled 2023-02-09: qty 1

## 2023-02-09 NOTE — ED Provider Triage Note (Signed)
Emergency Medicine Provider Triage Evaluation Note  Douglas Larson , a 37 y.o. male  was evaluated in triage.  Pt complains of pain redness and swelling of the right ring finger.  States he was working out in the yard recently, noticed redness at the base of the right ring finger with some swelling.  Was seen at urgent care and started on Pen-Vee K.  He notes taking 6 doses of the medication without relief.  He had swelling of his hand and finger yesterday that has somewhat improved.  He went back to urgent care today for recheck and noticed red streaks of his forearm that have extended now into his axilla.  Pain with flexion of the arm.  He notes having some tingling and numbness sensation of the finger.  Believes that he was bitten by something although does not recall an actual bite and did not see a spider or insect.  Review of Systems  Positive: Pain, redness, swelling right ring finger, red streaking right forearm and right upper arm Negative: Fever, chills,  Physical Exam  BP (!) 142/100 (BP Location: Right Arm)   Pulse 81   Temp 97.7 F (36.5 C) (Oral)   Resp 19   SpO2 100%  Gen:   Awake, no distress   Resp:  Normal effort  MSK:   Moves extremities without difficulty  Other:  Erythema, mild edema base of right ring finger, lymphangitis of the right forearm and right upper arm that extends to the axilla.  No definite streaking originating from the finger.  Medical Decision Making  Medically screening exam initiated at 5:39 PM.  Appropriate orders placed.  [name] was informed that the remainder of the evaluation will be completed by another provider, this initial triage assessment does not replace that evaluation, and the importance of remaining in the ED until their evaluation is complete.     Pauline Aus, PA-C 02/09/23 1746

## 2023-02-09 NOTE — ED Triage Notes (Signed)
Pt presents for further eval from UC for right ring finger swelling and redness, unsure if bitten by something.  UC thought it was a bite.  Was started on anbx and went back today for further eval as redness and streaking began up his arm.

## 2023-02-09 NOTE — ED Notes (Signed)
ED TO INPATIENT HANDOFF REPORT  ED Nurse Name and Phone #: Jess F   S Name/Age/Gender Douglas Larson 37 y.o. male Room/Bed: APA09/APA09  Code Status   Code Status: Not on file  Home/SNF/Other Home Patient oriented to: self, place, time, and situation Is this baseline? Yes   Triage Complete: Triage complete  Chief Complaint Cellulitis [L03.90]  Triage Note Pt presents for further eval from UC for right ring finger swelling and redness, unsure if bitten by something.  UC thought it was a bite.  Was started on anbx and went back today for further eval as redness and streaking began up his arm.     Allergies Allergies  Allergen Reactions   Prednisone     Level of Care/Admitting Diagnosis ED Disposition     ED Disposition  Admit   Condition  --   Comment  Hospital Area: [facility] [[id number]]  Level of Care: Med-Surg [16]  Covid Evaluation: Asymptomatic - no recent exposure (last 10 days) testing not required  Diagnosis: Cellulitis [161096]  Admitting Physician: Lilyan Gilford [[id number]]  Attending Physician: Lilyan Gilford [[id number]]          B Medical/Surgery History Past Medical History:  Diagnosis Date   High cholesterol    Past Surgical History:  Procedure Laterality Date   REFRACTIVE SURGERY       A IV Location/Drains/Wounds Patient Lines/Drains/Airways Status     Active Line/Drains/Airways     Name Placement date Placement time Site Days   Peripheral IV 02/09/23 20 G 1" Anterior;Distal;Left;Upper Arm 02/09/23  1816  Arm  less than 1            Intake/Output Last 24 hours  Intake/Output Summary (Last 24 hours) at 02/09/2023 2248 Last data filed at 02/09/2023 2002 Gross per 24 hour  Intake 100 ml  Output --  Net 100 ml    Labs/Imaging Results for orders placed or performed during the hospital encounter of 02/09/23 (from the past 48 hour(s))  CBC with Differential     Status: None   Collection Time: 02/09/23  5:45  PM  Result Value Ref Range   WBC 8.4 4.0 - 10.5 K/uL   RBC 5.16 4.22 - 5.81 MIL/uL   Hemoglobin 16.0 13.0 - 17.0 g/dL   HCT 29.5 62.1 - 30.8 %   MCV 92.1 80.0 - 100.0 fL   MCH 31.0 26.0 - 34.0 pg   MCHC 33.7 30.0 - 36.0 g/dL   RDW 65.7 84.6 - 96.2 %   Platelets 184 150 - 400 K/uL   nRBC 0.0 0.0 - 0.2 %   Neutrophils Relative % 63 %   Neutro Abs 5.3 1.7 - 7.7 K/uL   Lymphocytes Relative 25 %   Lymphs Abs 2.1 0.7 - 4.0 K/uL   Monocytes Relative 8 %   Monocytes Absolute 0.7 0.1 - 1.0 K/uL   Eosinophils Relative 3 %   Eosinophils Absolute 0.3 0.0 - 0.5 K/uL   Basophils Relative 1 %   Basophils Absolute 0.1 0.0 - 0.1 K/uL   Immature Granulocytes 0 %   Abs Immature Granulocytes 0.02 0.00 - 0.07 K/uL    Comment: Performed at [facility], [address], [place], [place] [address]  Basic metabolic panel     Status: None   Collection Time: 02/09/23  5:45 PM  Result Value Ref Range   Sodium 138 135 - 145 mmol/L   Potassium 4.2 3.5 - 5.1 mmol/L   Chloride 103 98 - 111  mmol/L   CO2 27 22 - 32 mmol/L   Glucose, Bld 85 70 - 99 mg/dL    Comment: Glucose reference range applies only to samples taken after fasting for at least 8 hours.   BUN 11 6 - 20 mg/dL   Creatinine, Ser 4.09 0.61 - 1.24 mg/dL   Calcium 9.3 8.9 - 81.1 mg/dL   GFR, Estimated >91 >47 mL/min    Comment: (NOTE) Calculated using the CKD-EPI Creatinine Equation (2021)    Anion gap 8 5 - 15    Comment: Performed at [facility], [address], [place], [place] [address]   DG Finger Ring Right  Result Date: 02/09/2023 CLINICAL DATA:  Swelling of finger. EXAM: RIGHT RING FINGER 4V COMPARISON:  None Available. FINDINGS: There is no evidence of fracture or dislocation. There is no evidence of arthropathy or other focal bone abnormality. Soft tissues are unremarkable. IMPRESSION: No acute osseous abnormality. Electronically Signed   By: [name] M.D.   On: 02/09/2023 18:11    Pending Labs Unresulted Labs (From admission,  onward)     Start     Ordered   02/09/23 2200  Blood culture (routine x 2)  BLOOD CULTURE X 2,   R (with STAT occurrences)     Question Answer Comment  Patient immune status Normal   Release to patient Immediate      02/09/23 2159            Vitals/Pain Today's Vitals   02/09/23 1827 02/09/23 1916 02/09/23 2136 02/09/23 2200  BP:    125/89  Pulse:   75 86  Resp:    18  Temp:      TempSrc:      SpO2:   97% 98%  Weight:      Height:      PainSc: 3  4       Isolation Precautions No active isolations  Medications Medications  cefTRIAXone (ROCEPHIN) 2 g in sodium chloride 0.9 % 100 mL IVPB (0 g Intravenous Stopped 02/09/23 2002)  ibuprofen (ADVIL) tablet 800 mg (800 mg Oral Given 02/09/23 2059)    Mobility walks     Focused Assessments skin , streaking up R arm    R Recommendations: See Admitting Provider Note  Report given to:   Additional Notes: n/a

## 2023-02-09 NOTE — ED Provider Notes (Signed)
[name] Provider Note   CSN: [id number] Arrival date & time: 02/09/23  1333     History  Chief Complaint  Patient presents with   Extremity infection    Douglas Larson is a 37 y.o. male.  Patient complains of a red swollen area on his right ring finger.  Patient was seen at urgent care.  Patient was seen there and started on antibiotics yesterday.  When he went back for a scheduled recheck today they sent him to the emergency department for further evaluation because he has streaks going up his arm.  Denies any fever he denies chills.  Patient reports that the streaks going up his arm are painful.  Patient reports he was handling roses several days ago and may have had a thorn stick him in his finger.  The history is provided by the patient. No language interpreter was used.       Home Medications Prior to Admission medications   Medication Sig Start Date End Date Taking? Authorizing Provider  amoxicillin (AMOXIL) 875 MG tablet Take 1 tablet (875 mg total) by mouth 2 (two) times daily. 03/04/22   [name], PA-C  brompheniramine-pseudoephedrine-DM 30-2-10 MG/5ML syrup Take 5 mLs by mouth 4 (four) times daily as needed. 07/28/22   Leath-Warren, [name], NP  cephALEXin (KEFLEX) 500 MG capsule Take 1 capsule (500 mg total) by mouth 4 (four) times daily. 04/25/20   Triplett, Tammy, PA-C  doxycycline (VIBRA-TABS) 100 MG tablet Take 1 tablet (100 mg total) by mouth 2 (two) times daily. 08/04/22   Leath-Warren, [name], NP  loratadine (CLARITIN) 10 MG tablet Take 1 tablet by mouth daily.    [provider]  oseltamivir (TAMIFLU) 75 MG capsule Take 1 capsule (75 mg total) by mouth every 12 (twelve) hours. 09/16/22   [name], PA-C  promethazine-dextromethorphan (PROMETHAZINE-DM) 6.25-15 MG/5ML syrup Take 5 mLs by mouth 4 (four) times daily as needed. 09/16/22   [name], PA-C       Allergies    Prednisone    Review of Systems   Review of Systems  All other systems reviewed and are negative.   Physical Exam Updated Vital Signs BP (!) 128/90 (BP Location: Left Arm)   Pulse 76   Temp 98 F (36.7 C) (Oral)   Resp 16   Ht 5\' 11"  (1.803 m)   Wt 104.3 kg   SpO2 100%   BMI 32.08 kg/m  Physical Exam Vitals reviewed.  Constitutional:      Appearance: Normal appearance.  Cardiovascular:     Rate and Rhythm: Normal rate.  Pulmonary:     Effort: Pulmonary effort is normal.  Musculoskeletal:        General: Swelling and tenderness present.     Comments: Swollen right ring finger raised swollen area at the crease of the MCP joint, decreased range of motion to pain red streaks palmar aspect of forearm and upper arm.  Skin:    General: Skin is warm.  Neurological:     General: No focal deficit present.     Mental Status: He is alert.  Psychiatric:        Mood and Affect: Mood normal.     ED Results / Procedures / Treatments   Labs (all labs ordered are listed, but only abnormal results are displayed) Labs Reviewed  CBC WITH DIFFERENTIAL/PLATELET  BASIC METABOLIC PANEL    EKG None  Radiology DG Finger Ring Right  Result Date: 02/09/2023 CLINICAL DATA:  Swelling of finger. EXAM: RIGHT RING FINGER 4V COMPARISON:  None Available. FINDINGS: There is no evidence of fracture or dislocation. There is no evidence of arthropathy or other focal bone abnormality. Soft tissues are unremarkable. IMPRESSION: No acute osseous abnormality. Electronically Signed   By: [name] M.D.   On: 02/09/2023 18:11    Procedures Procedures    Medications Ordered in ED Medications  ibuprofen (ADVIL) tablet 800 mg (has no administration in time range)  cefTRIAXone (ROCEPHIN) 2 g in sodium chloride 0.9 % 100 mL IVPB (0 g Intravenous Stopped 02/09/23 2002)    ED Course/ Medical Decision Making/ A&P                             Medical Decision Making Patient  complains of red streaks up his right arm and swelling to his left ring finger  Amount and/or Complexity of Data Reviewed Labs: ordered. Decision-making details documented in ED Course.    Details: Labs ordered reviewed and interpreted white blood cell count is normal Discussion of management or test interpretation with external provider(s): Hospitalist consulted for admission. I spoke to Dr. Renold Don  Infectious disease.  He advised blood cultures and vancomycin.   Risk Prescription drug management. Risk Details: Patient given IV Rocephin x 2 g.  Patient observed while in the emergency department he has had increased streaking of his right arm           Final Clinical Impression(s) / ED Diagnoses Final diagnoses:  Lymphangitis  Cellulitis of left ring finger    Rx / DC Orders ED Discharge Orders     None         [name] 02/09/23 2200    Cathren Laine, MD 02/10/23 [phone]

## 2023-02-10 ENCOUNTER — Inpatient Hospital Stay (HOSPITAL_COMMUNITY): Payer: 59

## 2023-02-10 DIAGNOSIS — L03012 Cellulitis of left finger: Secondary | ICD-10-CM

## 2023-02-10 DIAGNOSIS — Z888 Allergy status to other drugs, medicaments and biological substances status: Secondary | ICD-10-CM | POA: Diagnosis not present

## 2023-02-10 DIAGNOSIS — Z880 Allergy status to penicillin: Secondary | ICD-10-CM | POA: Diagnosis not present

## 2023-02-10 DIAGNOSIS — I891 Lymphangitis: Secondary | ICD-10-CM | POA: Diagnosis not present

## 2023-02-10 DIAGNOSIS — L03011 Cellulitis of right finger: Secondary | ICD-10-CM | POA: Diagnosis present

## 2023-02-10 DIAGNOSIS — L03113 Cellulitis of right upper limb: Secondary | ICD-10-CM

## 2023-02-10 DIAGNOSIS — T361X5A Adverse effect of cephalosporins and other beta-lactam antibiotics, initial encounter: Secondary | ICD-10-CM | POA: Diagnosis not present

## 2023-02-10 DIAGNOSIS — E7800 Pure hypercholesterolemia, unspecified: Secondary | ICD-10-CM | POA: Diagnosis present

## 2023-02-10 DIAGNOSIS — M7989 Other specified soft tissue disorders: Secondary | ICD-10-CM | POA: Diagnosis present

## 2023-02-10 DIAGNOSIS — W57XXXA Bitten or stung by nonvenomous insect and other nonvenomous arthropods, initial encounter: Secondary | ICD-10-CM | POA: Diagnosis present

## 2023-02-10 DIAGNOSIS — Z881 Allergy status to other antibiotic agents status: Secondary | ICD-10-CM | POA: Diagnosis not present

## 2023-02-10 LAB — MAGNESIUM: Magnesium: 2.2 mg/dL (ref 1.7–2.4)

## 2023-02-10 LAB — COMPREHENSIVE METABOLIC PANEL
ALT: 46 U/L — ABNORMAL HIGH (ref 0–44)
AST: 33 U/L (ref 15–41)
Albumin: 4 g/dL (ref 3.5–5.0)
Alkaline Phosphatase: 81 U/L (ref 38–126)
Anion gap: 9 (ref 5–15)
BUN: 12 mg/dL (ref 6–20)
CO2: 23 mmol/L (ref 22–32)
Calcium: 8.8 mg/dL — ABNORMAL LOW (ref 8.9–10.3)
Chloride: 105 mmol/L (ref 98–111)
Creatinine, Ser: 1.01 mg/dL (ref 0.61–1.24)
GFR, Estimated: >60 mL/min (ref >60)
Glucose, Bld: 100 mg/dL — ABNORMAL HIGH (ref 70–99)
Potassium: 3.8 mmol/L (ref 3.5–5.1)
Sodium: 137 mmol/L (ref 135–145)
Total Bilirubin: 0.5 mg/dL (ref 0.3–1.2)
Total Protein: 7 g/dL (ref 6.5–8.1)

## 2023-02-10 LAB — CBC WITH DIFFERENTIAL/PLATELET
Abs Immature Granulocytes: 0.02 10*3/uL (ref 0.00–0.07)
Basophils Absolute: 0.1 10*3/uL (ref 0.0–0.1)
Basophils Relative: 1 %
Eosinophils Absolute: 0.3 10*3/uL (ref 0.0–0.5)
Eosinophils Relative: 3 %
HCT: 45.4 % (ref 39.0–52.0)
Hemoglobin: 15.6 g/dL (ref 13.0–17.0)
Immature Granulocytes: 0 %
Lymphocytes Relative: 30 %
Lymphs Abs: 2.5 10*3/uL (ref 0.7–4.0)
MCH: 31.4 pg (ref 26.0–34.0)
MCHC: 34.4 g/dL (ref 30.0–36.0)
MCV: 91.3 fL (ref 80.0–100.0)
Monocytes Absolute: 0.7 10*3/uL (ref 0.1–1.0)
Monocytes Relative: 9 %
Neutro Abs: 4.9 10*3/uL (ref 1.7–7.7)
Neutrophils Relative %: 57 %
Platelets: 160 10*3/uL (ref 150–400)
RBC: 4.97 MIL/uL (ref 4.22–5.81)
RDW: 12.5 % (ref 11.5–15.5)
WBC: 8.4 10*3/uL (ref 4.0–10.5)
nRBC: 0 % (ref 0.0–0.2)

## 2023-02-10 LAB — CK: Total CK: 98 U/L (ref 49–397)

## 2023-02-10 LAB — SEDIMENTATION RATE: Sed Rate: 2 mm/hr (ref 0–16)

## 2023-02-10 LAB — CULTURE, BLOOD (ROUTINE X 2): Culture: NO GROWTH

## 2023-02-10 LAB — C-REACTIVE PROTEIN: CRP: 1.2 mg/dL — ABNORMAL HIGH (ref <1.0)

## 2023-02-10 MED ORDER — ONDANSETRON HCL 4 MG PO TABS: 4.0000 mg | ORAL_TABLET | Freq: Four times a day (QID) | ORAL | Status: DC | PRN

## 2023-02-10 MED ORDER — IOHEXOL 300 MG/ML  SOLN
100.0000 mL | Freq: Once | INTRAMUSCULAR | Status: AC | PRN
  Administered 2023-02-10: 100 mL via INTRAVENOUS

## 2023-02-10 MED ORDER — ACETAMINOPHEN 325 MG PO TABS
650.0000 mg | ORAL_TABLET | Freq: Four times a day (QID) | ORAL | Status: DC | PRN
  Administered 2023-02-10: 650 mg via ORAL
  Filled 2023-02-10: qty 2

## 2023-02-10 MED ORDER — POLYETHYLENE GLYCOL 3350 17 G PO PACK
17.0000 g | PACK | Freq: Every day | ORAL | Status: DC
  Administered 2023-02-10: 17 g via ORAL
  Filled 2023-02-10 (×2): qty 1

## 2023-02-10 MED ORDER — ACETAMINOPHEN 650 MG RE SUPP: 650.0000 mg | Freq: Four times a day (QID) | RECTAL | Status: DC | PRN

## 2023-02-10 MED ORDER — VANCOMYCIN HCL 2000 MG/400ML IV SOLN
2000.0000 mg | Freq: Once | INTRAVENOUS | Status: DC
  Administered 2023-02-10: 2000 mg via INTRAVENOUS
  Filled 2023-02-10 (×2): qty 400

## 2023-02-10 MED ORDER — HEPARIN SODIUM (PORCINE) 5000 UNIT/ML IJ SOLN
5000.0000 [IU] | Freq: Three times a day (TID) | INTRAMUSCULAR | Status: DC
  Administered 2023-02-10 – 2023-02-11 (×2): 5000 [IU] via SUBCUTANEOUS
  Filled 2023-02-10 (×3): qty 1

## 2023-02-10 MED ORDER — ONDANSETRON HCL 4 MG/2ML IJ SOLN: 4.0000 mg | Freq: Four times a day (QID) | INTRAMUSCULAR | Status: DC | PRN

## 2023-02-10 MED ORDER — VANCOMYCIN HCL 1250 MG/250ML IV SOLN: 1250.0000 mg | Freq: Two times a day (BID) | INTRAVENOUS | Status: DC

## 2023-02-10 MED ORDER — LINEZOLID 600 MG/300ML IV SOLN
600.0000 mg | Freq: Two times a day (BID) | INTRAVENOUS | Status: DC
  Administered 2023-02-10: 600 mg via INTRAVENOUS
  Filled 2023-02-10 (×4): qty 300

## 2023-02-10 NOTE — Hospital Course (Signed)
37 year old male with a history of hyperlipidemia presenting with erythema, pain of the right hand streaking up his right arm over the past 24 to 48 hours.  The patient first noted some pain and erythema around the proximal phalanx of his right ring finger on 02/04/2022.  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  He noted that the swelling about his right ring finger gradually worsened over the next 24 hours.  By 02/07/2023, he stated that his entire right hand was swollen with some worsening erythema of his right ring finger.  He went to urgent care on 02/08/2023 when he noted worsening erythema spreading proximally.  He was given a prescription for Pen-Vee K which he began taking.  He stated that the pain in his hand was a little better, but he had worsening erythema streaking up into his biceps.  He presented back to urgent care for recheck at which time he was directed to the emergency department for further evaluation and treatment.  He denies any fevers, chills, chest pain, shortness breath, cough, hemoptysis, nausea, vomiting or diarrhea, abdominal pain.  Denies any other rashes.  In the emergency department, the patient was afebrile and hemodynamically stable with oxygen saturation 96% room air.  WBC 8.4, hemoglobin 16.0, platelets 184,000.  Sodium 138, potassium 4.2, bicarbonate 27, serum creatinine 1.06.  The patient was started on ceftriaxone.  About 20 minutes into the infusion of ceftriaxone, the patient developed a burning type sensation about his right arm on the areas that were already red.  He denied any worsening erythema.  He denies any other rashes elsewhere on his body.  He denied any hives or shortness of breath.  His ceftriaxone was discontinued.  The patient was started on vancomycin.

## 2023-02-10 NOTE — Progress Notes (Signed)
Pt refused 1400 dose of heparin. MD notified. Will continue to monitor.

## 2023-02-10 NOTE — Consult Note (Signed)
[facility]                                                                                        Patient Identification: Patient Name: Douglas Larson MRN: [id number] Admit Date: 02/09/2023  4:42 PM Today's Date: 02/10/2023 Reason for consult: cellulitis  Requesting provider: Dr Tat  Principal Problem:   Cellulitis  Note - Recommendations are based solely on chart review as patient is not seen in person with limited capability ( remote consult)  Antibiotics:  Ceftriaxone 5/14 Vancomycin 5/15-c  Lines/Hardware:  Assessment # Rt ring finger cellulitis with erythema progressing to hand, forearm and arm ? Rose thorn injury  ? Insect bite Working out in the yard   Sporotrichosis considered in differential with ? Rose thorn injury but no mention of nodular lymphangitic pattern of spread in chart which is the common form of presentation. Will treat for common bacterial causes like strep/staph and assess  Recommendations  Will switch Vancomycin to Linezolid to cover staph, strep and anti-toxin effect.  Fu blood cultures A picture in the chart would be helpful  Low threshold for advanced imaging if worsening symptoms  Following, please call with active questions   Rest of the management as per the primary team. Please call with questions or concerns.  Thank you for the consult  __________________________________________________________________________________________________________ HPI and Hospital Course ( per chart review, remote consult) 37 Y O male with prior h/o HLD who presented to [facility] ED 5/14 with complaints of pain and swelling in the rt hand 4th digit. Swelling started on the finger Friday, got worse pn  Saturday, his whole hand was swollen on Monday. Went to UC who have Penicillin. Swelling came down on Tuesday however redness was worse  and erythema was streaking up his forearm and  arm including pain in his elbow and shoulder.   Was given ceftriaxone in the ED and 20 mins later erythema streaking up his arm became worse and felt like a fire feeling. Pain slowly got better after ceftriaxone was stopped. He also had diarrhea Friday and terrible headache Sat - Sun. Denied fevers but had chills.   At ED afebrile  CBC, BMP unremarkable  Imaging as below   ROS: unavailable   Past Medical History:  Diagnosis Date   High cholesterol    Past Surgical History:  Procedure Laterality Date   REFRACTIVE SURGERY     Scheduled Meds:  heparin  5,000 Units Subcutaneous Q8H   Continuous Infusions:  vancomycin     vancomycin 2,000 mg (02/10/23 0703)   PRN Meds:.acetaminophen **OR** acetaminophen, ondansetron **OR** ondansetron (ZOFRAN) IV  Allergies  Allergen Reactions   Penicillins Dermatitis   Prednisone    Social History   Socioeconomic History   Marital status: Single    Spouse name: Not on file   Number of children: Not on file   Years of education: Not on file   Highest education level: Not on file  Occupational History   Not on file  Tobacco Use   Smoking status: Never    Passive exposure: Yes   Smokeless tobacco: Never  Vaping Use  Vaping Use: Never used  Substance and Sexual Activity   Alcohol use: Not Currently   Drug use: Never   Sexual activity: Yes  Other Topics Concern   Not on file  Social History Narrative   Not on file   Social Determinants of Health   Financial Resource Strain: Not on file  Food Insecurity: No Food Insecurity (02/10/2023)   Hunger Vital Sign    Worried About Running Out of Food in the Last Year: Never true    Ran Out of Food in the Last Year: Never true  Transportation Needs: No Transportation Needs (02/10/2023)   PRAPARE - Administrator, Civil Service (Medical): No    Lack of Transportation (Non-Medical): No  Physical Activity: Not on file  Stress: Not on file  Social Connections: Not on file   Intimate Partner Violence: Not At Risk (02/10/2023)   Humiliation, Afraid, Rape, and Kick questionnaire    Fear of Current or Ex-Partner: No    Emotionally Abused: No    Physically Abused: No    Sexually Abused: No   History reviewed. No pertinent family history.  Vitals BP 120/84 (BP Location: Right Arm)   Pulse 65   Temp 98.4 F (36.9 C)   Resp 16   Ht 5\' 11"  (1.803 m)   Wt 103.7 kg   SpO2 96%   BMI 31.89 kg/m   Pertinent Microbiology Results for orders placed or performed during the hospital encounter of 02/09/23  Blood culture (routine x 2)     Status: None (Preliminary result)   Collection Time: 02/09/23 10:28 PM   Specimen: BLOOD  Result Value Ref Range Status   Specimen Description BLOOD BLOOD RIGHT ARM  Final   Special Requests   Final    Normal BOTTLES DRAWN AEROBIC AND ANAEROBIC Blood Culture adequate volume Performed at [facility], [address], [place], [place] [address]    Culture PENDING  Incomplete   Report Status PENDING  Incomplete  Blood culture (routine x 2)     Status: None (Preliminary result)   Collection Time: 02/09/23 10:35 PM   Specimen: BLOOD  Result Value Ref Range Status   Specimen Description BLOOD BLOOD RIGHT ARM  Final   Special Requests   Final    Normal BOTTLES DRAWN AEROBIC AND ANAEROBIC Blood Culture adequate volume Performed at [facility], [address], [place], [place] [address]    Culture PENDING  Incomplete   Report Status PENDING  Incomplete    Pertinent Lab seen by me:    Latest Ref Rng & Units 02/10/2023    5:09 AM 02/09/2023    5:45 PM  CBC  WBC 4.0 - 10.5 K/uL 8.4  8.4   Hemoglobin 13.0 - 17.0 g/dL 09.8  11.9   Hematocrit 39.0 - 52.0 % 45.4  47.5   Platelets 150 - 400 K/uL 160  184       Latest Ref Rng & Units 02/10/2023    5:09 AM 02/09/2023    5:45 PM  CMP  Glucose 70 - 99 mg/dL 147  85   BUN 6 - 20 mg/dL 12  11   Creatinine 8.29 - 1.24 mg/dL 5.62  1.30   Sodium 865 - 145 mmol/L 137  138   Potassium  3.5 - 5.1 mmol/L 3.8  4.2   Chloride 98 - 111 mmol/L 105  103   CO2 22 - 32 mmol/L 23  27   Calcium 8.9 - 10.3 mg/dL 8.8  9.3  Total Protein 6.5 - 8.1 g/dL 7.0    Total Bilirubin 0.3 - 1.2 mg/dL 0.5    Alkaline Phos 38 - 126 U/L 81    AST 15 - 41 U/L 33    ALT 0 - 44 U/L 46       Pertinent Imagings/Other Imagings Plain films and CT images have been personally visualized and interpreted; radiology reports have been reviewed. Decision making incorporated into the Impression / Recommendations.  DG Finger Ring Right  Result Date: 02/09/2023 CLINICAL DATA:  Swelling of finger. EXAM: RIGHT RING FINGER 4V COMPARISON:  None Available. FINDINGS: There is no evidence of fracture or dislocation. There is no evidence of arthropathy or other focal bone abnormality. Soft tissues are unremarkable. IMPRESSION: No acute osseous abnormality. Electronically Signed   By: [name] M.D.   On: 02/09/2023 18:11    I have personally spent 50 minutes involved in face-to-face and non-face-to-face activities for this patient on the day of the visit. Professional time spent includes the following activities: Preparing to see the patient (review of tests), Obtaining and/or reviewing separately obtained history (admission/discharge record), Performing a medically appropriate examination and/or evaluation , Ordering medications/tests/procedures, referring and communicating with other health care professionals, Documenting clinical information in the EMR, Independently interpreting results (not separately reported), Communicating results to the patient/family/caregiver, Counseling and educating the patient/family/caregiver and Care coordination (not separately reported).  Electronically signed by:   Plan d/w requesting provider as well as ID pharm D  Note: This document was prepared using dragon voice recognition software and may include unintentional dictation errors.   Odette Fraction, MD Infectious Disease  Physician [facility] for Infect[facility] Disease Pager: [phone]

## 2023-02-10 NOTE — Progress Notes (Signed)
Pharmacy Antibiotic Note  Douglas Larson is a 37 y.o. male admitted on 02/09/2023 with cellulitis.  Pharmacy has been consulted for vancomycin dosing.  Plan: Vancomycin 2000mg  x1 then 1250mg  IV Q12H. Goal AUC 400-550.  Expected AUC 525.  Height: 5\' 11"  (180.3 cm) Weight: 103.7 kg (228 lb 9.9 oz) IBW/kg (Calculated) : 75.3  Temp (24hrs), Avg:97.9 F (36.6 C), Min:97.6 F (36.4 C), Max:98.4 F (36.9 C)  Recent Labs  Lab 02/09/23 1745 02/10/23 0509  WBC 8.4 8.4  CREATININE 1.06 1.01    Estimated Creatinine Clearance: 121.6 mL/min (by C-G formula based on SCr of 1.01 mg/dL).    Allergies  Allergen Reactions   Penicillins Dermatitis   Prednisone     Thank you for allowing pharmacy to be a part of this patient's care.  Vernard Gambles, PharmD, BCPS  02/10/2023 6:05 AM

## 2023-02-10 NOTE — H&P (Signed)
History and Physical    Patient: Douglas Larson FTD:[id number] DOB: 01-15-1986 DOA: 02/09/2023 DOS: the patient was seen and examined on 02/10/2023 PCP: Arsenio Loader, [facility]  Patient coming from: Home  Chief Complaint:  Chief Complaint  Patient presents with   Extremity infection   HPI: Douglas Larson is a 37 y.o. male with medical history significant of hyperlipidemia, presents the ED with a chief complaint of right hand fourth digit pain.  Patient reports that he had swelling that started in the finger on Friday.  It got worse on Saturday.  On Monday his whole hand was swollen.  He went to the urgent care, and they prescribed him penicillin per his report.  On Tuesday the swelling was down but the erythema was worse.  He had erythema streaking up his arm.  At the beginning of the day it was just on the dorsal aspect of his wrist and partially up his forearm.  On the later part of the day it went past his elbow up into his bicep area.  He had pain in his elbow and shoulder.  The pain in his hand was actually better.  Patient reports the pain in the hand was like a pressure when it was there.  The pain in the arm was more like a stabbing pain with palpation, and hypersensitive.  Patient was given Rocephin in the ED.  He reports 20 minutes into the Rocephin and the erythema streaking up his arm became much worse and he felt like sandpaper was digging into his arm.  Reports it eventually became like a fire feeling.  After the Rocephin was stopped, the pain slowly got better.  Patient declines any pain meds because of a history of substance abuse on his mother side that he is worried he might be predisposed to.  On review of systems patient reports he had diarrhea on Friday, he otherwise just had general malaise and a terrible headache on Saturday and Sunday.  He had chills but does not think that he had a fever.  Patient has no other complaints at this time.  Patient does not smoke, does not  drink.  Get vaccinated for flu but not COVID.  Patient is full code. Review of Systems: As mentioned in the history of present illness. All other systems reviewed and are negative. Past Medical History:  Diagnosis Date   High cholesterol    Past Surgical History:  Procedure Laterality Date   REFRACTIVE SURGERY     Social History:  reports that he has never smoked. He has been exposed to tobacco smoke. He has never used smokeless tobacco. He reports that he does not currently use alcohol. He reports that he does not use drugs.  Allergies  Allergen Reactions   Penicillins Dermatitis   Prednisone     History reviewed. No pertinent family history.  Prior to Admission medications   Medication Sig Start Date End Date Taking? Authorizing Provider  amoxicillin (AMOXIL) 875 MG tablet Take 1 tablet (875 mg total) by mouth 2 (two) times daily. 03/04/22   [name], PA-C  brompheniramine-pseudoephedrine-DM 30-2-10 MG/5ML syrup Take 5 mLs by mouth 4 (four) times daily as needed. 07/28/22   Leath-Warren, [name], NP  cephALEXin (KEFLEX) 500 MG capsule Take 1 capsule (500 mg total) by mouth 4 (four) times daily. 04/25/20   Triplett, Tammy, PA-C  doxycycline (VIBRA-TABS) 100 MG tablet Take 1 tablet (100 mg total) by mouth 2 (two) times daily. 08/04/22   Leath-Warren, Lorene Dy  J, NP  loratadine (CLARITIN) 10 MG tablet Take 1 tablet by mouth daily.    [provider]  oseltamivir (TAMIFLU) 75 MG capsule Take 1 capsule (75 mg total) by mouth every 12 (twelve) hours. 09/16/22   [name], PA-C  promethazine-dextromethorphan (PROMETHAZINE-DM) 6.25-15 MG/5ML syrup Take 5 mLs by mouth 4 (four) times daily as needed. 09/16/22   [name], PA-C    Physical Exam: Vitals:   02/09/23 2136 02/09/23 2200 02/09/23 2330 02/10/23 0436  BP:  125/89 138/89 120/84  Pulse: 75 86 80 65  Resp:  18 20 16   Temp:   97.6 F (36.4 C) 98.4 F (36.9 C)  TempSrc:   Oral    SpO2: 97% 98% 94% 96%  Weight:   103.7 kg   Height:       1.  General: Patient lying supine in bed,  no acute distress   2. Psychiatric: Alert and oriented x 3, mood and behavior normal for situation, pleasant and cooperative with exam   3. Neurologic: Speech and language are normal, face is symmetric, moves all 4 extremities voluntarily, at baseline without acute deficits on limited exam   4. HEENMT:  Head is atraumatic, normocephalic, pupils reactive to light, neck is supple, trachea is midline, mucous membranes are moist   5. Respiratory : Lungs are clear to auscultation bilaterally without wheezing, rhonchi, rales, no cyanosis, no increase in work of breathing or accessory muscle use   6. Cardiovascular : Heart rate normal, rhythm is regular, no murmurs, rubs or gallops, no peripheral edema, peripheral pulses palpated   7. Gastrointestinal:  Abdomen is soft, nondistended, nontender to palpation bowel sounds active, no masses or organomegaly palpated   8. Skin:  Streaking up the arm   9.Musculoskeletal:  Erythema in the fourth digit right hand, streaking on the medial aspect of his elbow going up to his bicep, and dorsal aspect of his wrist Data Reviewed: In the ED Temp 97.7-98, heart rate 76-81, respiratory rate 16-19, blood pressure 128/90-142/100, satting at 100% No leukocytosis with a white blood cell count of 8.4, hemoglobin 16.0 Chemistry is unremarkable Chest x-ray shows no acute abnormality X-ray of the finger shows no acute osseous abnormality Patient was given 2 g of Rocephin Infectious disease was consulted and recommended starting vancomycin Admission requested for cellulitis  Assessment and Plan: * Cellulitis - Failed outpatient antibiotic treatment with penicillin per patient - Worsening streaking just while in the ER - Started on Rocephin - There was a question of a possible rose thorn injury which led to suspicion for fungal infection - Infectious  disease was consulted and recommended vancomycin - Infectious disease plans to follow - No leukocytosis, no fever - Continue to monitor      Advance Care Planning:   Code Status: Full Code  Consults: Infectious disease  Family Communication: No family at bedside  Severity of Illness: The appropriate patient status for this patient is OBSERVATION. Observation status is judged to be reasonable and necessary in order to provide the required intensity of service to ensure the patient's safety. The patient's presenting symptoms, physical exam findings, and initial radiographic and laboratory data in the context of their medical condition is felt to place them at decreased risk for further clinical deterioration. Furthermore, it is anticipated that the patient will be medically stable for discharge from the hospital within 2 midnights of admission.   Author: Lilyan Gilford, DO 02/10/2023 5:08 AM  For on call review [email or web address].

## 2023-02-10 NOTE — Progress Notes (Signed)
  Transition of Care [facility]) Screening Note   Patient Details  Name: Douglas Larson Date of Birth: 07-28-1986   Transition of Care [facility]) CM/SW Contact:    Villa Herb, LCSWA Phone Number: 02/10/2023, 9:55 AM    Transition of Care Department [facility]) has reviewed patient and no TOC needs have been identified at this time. We will continue to monitor patient advancement through interdisciplinary progression rounds. If new patient transition needs arise, please place a TOC consult.

## 2023-02-10 NOTE — Assessment & Plan Note (Signed)
-   Failed outpatient antibiotic treatment with penicillin per patient - Worsening streaking just while in the ER - Started on Rocephin - There was a question of a possible rose thorn injury which led to suspicion for fungal infection - Infectious disease was consulted and recommended vancomycin - Infectious disease plans to follow - No leukocytosis, no fever - Continue to monitor

## 2023-02-10 NOTE — Progress Notes (Signed)
PROGRESS NOTE  Douglas Larson RUE:[id number] DOB: 21-Apr-1986 DOA: 02/09/2023 PCP: Raynelle Bring [facility]  Brief History:  37 year old male with a history of hyperlipidemia presenting with erythema, pain of the right hand streaking up his right arm over the past 24 to 48 hours.  The patient first noted some pain and erythema around the proximal phalanx of his right ring finger on 02/04/2022.  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  He noted that the swelling about his right ring finger gradually worsened over the next 24 hours.  By 02/07/2023, he stated that his entire right hand was swollen with some worsening erythema of his right ring finger.  He went to urgent care on 02/08/2023 when he noted worsening erythema spreading proximally.  He was given a prescription for Pen-Vee K which he began taking.  He stated that the pain in his hand was a little better, but he had worsening erythema streaking up into his biceps.  He presented back to urgent care for recheck at which time he was directed to the emergency department for further evaluation and treatment.  He denies any fevers, chills, chest pain, shortness breath, cough, hemoptysis, nausea, vomiting or diarrhea, abdominal pain.  Denies any other rashes.  In the emergency department, the patient was afebrile and hemodynamically stable with oxygen saturation 96% room air.  WBC 8.4, hemoglobin 16.0, platelets 184,000.  Sodium 138, potassium 4.2, bicarbonate 27, serum creatinine 1.06.  The patient was started on ceftriaxone.  About 20 minutes into the infusion of ceftriaxone, the patient developed a burning type sensation about his right arm on the areas that were already red.  He denied any worsening erythema.  He denies any other rashes elsewhere on his body.  He denied any hives or shortness of breath.  His ceftriaxone was discontinued.  The patient was started on vancomycin.    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  Greater than 50% spent face to face counseling and coordinating care.   Subjective: Patient feels that the right hand and arm are little bit better regarding pain, edema, and erythema.  he denies any fevers, chills, chest pain, shortness of breath, nausea, vomiting, diarrhea  Objective: Vitals:   02/09/23 2136 02/09/23 2200 02/09/23 2330 02/10/23 0436  BP:  125/89 138/89 120/84  Pulse: 75 86 80 65  Resp:  18 20 16   Temp:   97.6 F (36.4 C) 98.4 F (36.9 C)  TempSrc:   Oral   SpO2: 97% 98% 94% 96%  Weight:   103.7 kg   Height:        Intake/Output Summary (Last 24 hours) at 02/10/2023 0810 Last data filed at 02/09/2023 2002 Gross per 24 hour  Intake 100 ml  Output --  Net 100 ml   Weight  change:  Exam:  General:  Pt is alert, follows commands appropriately, not in acute distress HEENT: No icterus, No thrush, No neck mass, [place]/AT Cardiovascular: RRR, S1/S2, no rubs, no gallops Respiratory: CTA bilaterally, no wheezing, no crackles, no rhonchi Abdomen: Soft/+BS, non tender, non distended, no guarding Extremities: See pictures below of the right hand and arm          Data Reviewed: I have personally reviewed following labs and imaging studies Basic Metabolic Panel: Recent Labs  Lab 02/09/23 1745 02/10/23 0509  NA 138 137  K  4.2 3.8  CL 103 105  CO2 27 23  GLUCOSE 85 100*  BUN 11 12  CREATININE 1.06 1.01  CALCIUM 9.3 8.8*  MG  --  2.2   Liver Function Tests: Recent Labs  Lab 02/10/23 0509  AST 33  ALT 46*  ALKPHOS 81  BILITOT 0.5  PROT 7.0  ALBUMIN 4.0   No results for input(s): "LIPASE", "AMYLASE" in the last 168 hours. No results for input(s): "AMMONIA" in the last 168 hours. Coagulation Profile: No results for input(s): "INR", "PROTIME" in the last 168 hours. CBC: Recent Labs  Lab 02/09/23 1745 02/10/23 0509  WBC 8.4 8.4  NEUTROABS 5.3 4.9  HGB 16.0 15.6  HCT 47.5 45.4  MCV 92.1 91.3  PLT 184 160   Cardiac Enzymes: No results for input(s): "CKTOTAL", "CKMB", "CKMBINDEX", "TROPONINI" in the last 168 hours. BNP: Invalid input(s): "POCBNP" CBG: No results for input(s): "GLUCAP" in the last 168 hours. HbA1C: No results for input(s): "HGBA1C" in the last 72 hours. Urine analysis: No results found for: "COLORURINE", "APPEARANCEUR", "LABSPEC", "PHURINE", "GLUCOSEU", "HGBUR", "BILIRUBINUR", "KETONESUR", "PROTEINUR", "UROBILINOGEN", "NITRITE", "LEUKOCYTESUR" Sepsis Labs: @LABRCNTIP (procalcitonin:4,lacticidven:4) ) Recent Results (from the past 240 hour(s))  Blood culture (routine x 2)     Status: None (Preliminary result)   Collection Time: 02/09/23 10:28 PM   Specimen: BLOOD  Result Value Ref Range Status   Specimen Description BLOOD BLOOD RIGHT ARM  Final   Special Requests   Final    Normal BOTTLES DRAWN AEROBIC AND ANAEROBIC Blood Culture adequate volume   Culture   Final    NO GROWTH < 12 HOURS Performed at [facility], [address] [place], [place] [address]    Report Status PENDING  Incomplete  Blood culture (routine x 2)     Status: None (Preliminary result)   Collection Time: 02/09/23 10:35 PM   Specimen: BLOOD  Result Value Ref Range Status   Specimen Description BLOOD BLOOD RIGHT ARM  Final   Special Requests   Final    Normal BOTTLES DRAWN AEROBIC AND ANAEROBIC  Blood Culture adequate volume   Culture   Final    NO GROWTH < 12 HOURS Performed at [facility], [address], [place], [place] [address]    Report Status PENDING  Incomplete     Scheduled Meds:  heparin  5,000 Units Subcutaneous Q8H   Continuous Infusions:  linezolid (ZYVOX) IV      Procedures/Studies: DG Finger Ring Right  Result Date: 02/09/2023 CLINICAL DATA:  Swelling of finger. EXAM: RIGHT RING FINGER 4V COMPARISON:  None Available. FINDINGS: There is no evidence of fracture or dislocation. There is no evidence of arthropathy or other focal bone abnormality. Soft tissues are unremarkable. IMPRESSION: No acute osseous abnormality. Electronically Signed   By: [name] M.D.   On: 02/09/2023 18:11    Catarina Hartshorn, DO  [facility]  If 7PM-7AM, please contact night-coverage [email or web address] Password [facility] 02/10/2023,  8:10 AM   LOS: 0 days

## 2023-02-10 NOTE — Progress Notes (Signed)
Was called into pt's room d/t c/o increased redness to right arm, pt feeling flushed, and new onset of weakness. All vitals were WNL.  Pt was receiving infusion of Vancomycin. Vancomycin stopped, IV flushed and pt's symptoms improved. MD notified. Will continue to monitor.

## 2023-02-11 ENCOUNTER — Telehealth (HOSPITAL_COMMUNITY): Payer: Self-pay | Admitting: Pharmacy Technician

## 2023-02-11 ENCOUNTER — Other Ambulatory Visit (HOSPITAL_COMMUNITY): Payer: Self-pay

## 2023-02-11 DIAGNOSIS — L03113 Cellulitis of right upper limb: Secondary | ICD-10-CM | POA: Diagnosis not present

## 2023-02-11 DIAGNOSIS — L03012 Cellulitis of left finger: Secondary | ICD-10-CM | POA: Diagnosis not present

## 2023-02-11 LAB — MISC LABCORP TEST (SEND OUT): Labcorp test code: 83935

## 2023-02-11 LAB — CULTURE, BLOOD (ROUTINE X 2): Culture: NO GROWTH

## 2023-02-11 MED ORDER — LINEZOLID 600 MG PO TABS
600.0000 mg | ORAL_TABLET | Freq: Two times a day (BID) | ORAL | Status: DC
  Administered 2023-02-11: 600 mg via ORAL
  Filled 2023-02-11 (×3): qty 1

## 2023-02-11 MED ORDER — LINEZOLID 600 MG PO TABS: 600.0000 mg | ORAL_TABLET | Freq: Two times a day (BID) | ORAL | 0 refills | Status: DC

## 2023-02-11 NOTE — Telephone Encounter (Signed)
Pharmacy Patient Advocate Encounter  Insurance verification completed.    The patient is insured through [employer]   The patient is currently admitted and ran test claims for the following: linezolid (Zyvox) .  Copays and coinsurance results were relayed to Inpatient clinical team.  

## 2023-02-11 NOTE — Discharge Summary (Signed)
Physician Discharge Summary   Patient: Douglas Larson MRN: [id number] DOB: 08/18/1986  Admit date:     02/09/2023  Discharge date: 02/11/23  Discharge Physician: Onalee Hua Ferron [name]   PCP: Raynelle Bring [facility]   Recommendations at discharge:   Please follow up with primary care provider within 1-2 weeks  Please repeat BMP and CBC in one week     Hospital Course: 37 year old male with a history of hyperlipidemia presenting with erythema, pain of the right hand streaking up his right arm over the past 24 to 48 hours.  The patient first noted some pain and erythema around the proximal phalanx of his right ring finger on 02/04/2022.  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  He noted that the swelling about his right ring finger gradually worsened over the next 24 hours.  By 02/07/2023, he stated that his entire right hand was swollen with some worsening erythema of his right ring finger.  He went to urgent care on 02/08/2023 when he noted worsening erythema spreading proximally.  He was given a prescription for Pen-Vee K which he began taking.  He stated that the pain in his hand was a little better, but he had worsening erythema streaking up into his biceps.  He presented back to urgent care for recheck at which time he was directed to the emergency department for further evaluation and treatment.  He denies any fevers, chills, chest pain, shortness breath, cough, hemoptysis, nausea, vomiting or diarrhea, abdominal pain.  Denies any other rashes.  In the emergency department, the patient was afebrile and hemodynamically stable with oxygen saturation 96% room air.  WBC 8.4, hemoglobin 16.0, platelets 184,000.  Sodium 138, potassium 4.2, bicarbonate 27, serum creatinine 1.06.  The patient was started on ceftriaxone.  About 20 minutes into the infusion of ceftriaxone, the patient developed a burning type sensation about his right arm on the areas  that were already red.  He denied any worsening erythema.  He denies any other rashes elsewhere on his body.  He denied any hives or shortness of breath.  His ceftriaxone was discontinued.  The patient was started on vancomycin.  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   loratadine 10 MG tablet Commonly known as: CLARITIN Take 1 tablet by mouth daily.        Discharge Exam: Filed Weights   02/09/23 1826 02/09/23 2330  Weight: 104.3 kg 103.7 kg   HEENT:  [place]/AT, No thrush, no icterus CV:  RRR, no rub, no S3, no S4 Lung:  CTA, no wheeze, no rhonchi Abd:  soft/+BS, NT Ext:  right  hand--ring finger with dark/maroon edema prox phalanx without open wound or drainage.  PIP and MCP joints without erythema or drainage.  Mild erythema dorsal forearm and bicep area without drainage   Condition at discharge: stable  The results of significant diagnostics from this hospitalization (including imaging, microbiology, ancillary and laboratory) are listed below for reference.   Imaging Studies: CT FOREARM RIGHT W CONTRAST  Result Date: 02/10/2023 CLINICAL DATA:  Deep  right forearm cellulitis. Soft tissue mass on dorsum. Evaluate for fluid collection. 37 year old male with history of hyperlipidemia presenting with erythema and pain of the right hand and streaking up his right arm over the past 24-48 hours. First noted some pain and erythema around the proximal phalanx of the right ring finger 02/04/2022. 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 Deep cellulitis of the right ring finger. EXAM: CT OF THE UPPER RIGHT EXTREMITY WITH CONTRAST TECHNIQUE: Multidetector CT imaging of the upper right extremity was performed according to the standard protocol following intravenous contrast administration. RADIATION DOSE REDUCTION: This exam was performed according to the departmental dose-optimization program which includes automated exposure control, adjustment of the mA and/or kV according to patient size and/or use of iterative reconstruction technique. CONTRAST:  OMNIPAQUE IOHEXOL 300 MG/ML  SOLN COMPARISON:  None Available. FINDINGS: Bones/Joint/Cartilage Normal bone mineralization. The cortices are intact. No acute fracture or dislocation. Ligaments Suboptimally assessed by CT. Muscles and Tendons Normal size and density of the regional musculature. No gross tendon tear is visualized. Soft tissues There is mild-to-moderate subcutaneous fat stranding and mild swelling at the distal dorsal forearm, greatest dorsal to the distal radial metadiaphysis (sagittal series 10, image 46 and axial series 6, image 45). There is mild subcutaneous fat stranding and minimal swelling of the subcutaneous fat of the slightly volar aspect of the ulnar side of the proximal 50% of the forearm (axial series 6, images 93 through 133, sagittal series 10 image 29, coronal series 8, image 28). No walled-off fluid collection is seen. There is mild subcutaneous fat edema without significant swelling at the posterior aspect of the elbow (axial series 6, image 141). No  walled-off fluid collection is seen. IMPRESSION: Mild-to-moderate subcutaneous fat stranding and mild swelling at the distal dorsal forearm, greatest dorsal to the distal radial metadiaphysis. There is mild subcutaneous fat stranding and minimal swelling at the slightly volar aspect of the ulnar side of the proximal 50% of the forearm. No walled-off fluid collection is seen. Electronically Signed   By: Neita Garnet M.D.   On: 02/10/2023 16:32   CT HAND RIGHT W CONTRAST  Result Date: 02/10/2023 CLINICAL DATA:  37 year old male with history of hyperlipidemia presenting with erythema and pain of the right hand and streaking up his right arm over the past 24-48 hours. First noted some pain and erythema around the proximal phalanx of the right ring finger 02/04/2022. 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 Deep cellulitis of the right ring finger. Evaluate for fluid collection. Soft tissue mass of right hand. MRI scanner down for the day. EXAM: CT OF THE UPPER RIGHT EXTREMITY WITH CONTRAST TECHNIQUE: Multidetector CT imaging of the upper right extremity was performed according to the standard protocol following intravenous contrast administration. RADIATION DOSE REDUCTION: This exam was performed according to the departmental dose-optimization program which includes automated exposure control, adjustment of the mA and/or kV according to patient size and/or use of iterative reconstruction technique. CONTRAST:   OMNIPAQUE IOHEXOL 300 MG/ML  SOLN COMPARISON:  Right fourth finger radiographs 02/09/2023 FINDINGS: Bones/Joint/Cartilage Normal bone mineralization. The cortices are intact. No acute fracture or dislocation. Ligaments Suboptimally assessed by CT. Muscles and Tendons The visualized flexor and extensor tendons appear intact. The proximal palmar lateral cellulitis of the fourth finger comes close to the flexor tendon, however no definite flexor tendon sheath enlargement or  fluid is visualized. Soft tissues There is moderate soft tissue swelling with increased density within the subcutaneous fat at the palmar lateral/radial aspect of the fourth finger at the level of the distal 50% of the proximal phalanx (axial series 10, image 135 and sagittal series 9, image 112). This is consistent with reported cellulitis. No walled-off fluid collection is seen. There is mild subcutaneous fat stranding within the proximal medial/ulnar, palmar subcutaneous fat which may represent additional mild swelling (axial series 10, image 257). IMPRESSION: Moderate soft tissue swelling with increased density within the subcutaneous fat at the palmar lateral/radial aspect of the fourth finger at the level of the distal 50% of the proximal phalanx. This is consistent with the reported cellulitis. No walled-off fluid collection is seen. No CT evidence of osteomyelitis. Electronically Signed   By: Neita Garnet M.D.   On: 02/10/2023 16:19   DG Finger Ring Right  Result Date: 02/09/2023 CLINICAL DATA:  Swelling of finger. EXAM: RIGHT RING FINGER 4V COMPARISON:  None Available. FINDINGS: There is no evidence of fracture or dislocation. There is no evidence of arthropathy or other focal bone abnormality. Soft tissues are unremarkable. IMPRESSION: No acute osseous abnormality. Electronically Signed   By: [name] M.D.   On: 02/09/2023 18:11    Microbiology: Results for orders placed or performed during the hospital encounter of 02/09/23  Blood culture (routine x 2)     Status: None (Preliminary result)   Collection Time: 02/09/23 10:28 PM   Specimen: BLOOD  Result Value Ref Range Status   Specimen Description BLOOD BLOOD RIGHT ARM  Final   Special Requests   Final    Normal BOTTLES DRAWN AEROBIC AND ANAEROBIC Blood Culture adequate volume   Culture   Final    NO GROWTH 2 DAYS Performed at [facility], [address] [place], [place] [address]    Report Status PENDING  Incomplete  Blood  culture (routine x 2)     Status: None (Preliminary result)   Collection Time: 02/09/23 10:35 PM   Specimen: BLOOD  Result Value Ref Range Status   Specimen Description BLOOD BLOOD RIGHT ARM  Final   Special Requests   Final    Normal BOTTLES DRAWN AEROBIC AND ANAEROBIC Blood Culture adequate volume   Culture   Final    NO GROWTH 2 DAYS Performed at [facility], [address] [place], [place] [address]    Report Status PENDING  Incomplete    Labs: CBC: Recent Labs  Lab 02/09/23 1745 02/10/23 0509  WBC 8.4 8.4  NEUTROABS 5.3 4.9  HGB 16.0 15.6  HCT 47.5 45.4  MCV 92.1 91.3  PLT 184 160   Basic Metabolic Panel: Recent Labs  Lab 02/09/23 1745 02/10/23 0509  NA 138 137  K 4.2 3.8  CL 103 105  CO2 27 23  GLUCOSE 85 100*  BUN 11 12  CREATININE 1.06 1.01  CALCIUM 9.3 8.8*  MG  --  2.2   Liver Function Tests: Recent Labs  Lab 02/10/23 0509  AST 33  ALT 46*  ALKPHOS 81  BILITOT 0.5  PROT  7.0  ALBUMIN 4.0   CBG: No results for input(s): "GLUCAP" in the last 168 hours.  Discharge time spent: greater than 30 minutes.  Signed: Catarina Hartshorn, MD [facility] 02/11/2023

## 2023-02-11 NOTE — TOC Benefit Eligibility Note (Signed)
Patient Product/process development scientist completed.    The patient is currently admitted and upon discharge could be taking linezolid (Zyvox) 600 mg tablets.  The current 14 day co-pay is $62.68.   The patient is insured through [employer]   This test claim was processed through [facility]- copay amounts may vary at other pharmacies due to pharmacy/plan contracts, or as the patient moves through the different stages of their insurance plan.  Roland Earl, CPHT Pharmacy Patient Advocate Specialist [facility] Patient Advocate Team Direct Number: [phone]  Fax: [phone]

## 2023-02-12 LAB — CULTURE, BLOOD (ROUTINE X 2)

## 2023-02-13 LAB — CULTURE, BLOOD (ROUTINE X 2)

## 2023-02-14 ENCOUNTER — Ambulatory Visit
Admission: EM | Admit: 2023-02-14 | Discharge: 2023-02-14 | Disposition: A | Discharge status: Home / Self Care | Payer: 59

## 2023-02-14 DIAGNOSIS — L02511 Cutaneous abscess of right hand: Secondary | ICD-10-CM | POA: Diagnosis not present

## 2023-02-14 LAB — CULTURE, BLOOD (ROUTINE X 2)

## 2023-02-14 NOTE — Discharge Instructions (Addendum)
Keep it clean with warm water and soap Apply small amount of Neosporin or antibiotic ointment Continue to monitor for swelling, worsening pain, turning black or purple Continue to take antibiotic  as prescribed and to completion Follow-up with PCP Return sooner or go to the ED if you have any new or worsening symptoms such as increased redness, swelling, pain, nausea, vomiting, fever, chills, etc..Marland Kitchen

## 2023-02-14 NOTE — ED Provider Notes (Signed)
[facility]   [id number] 02/14/23 Arrival Time: 1236   Chief Complaint  Patient presents with   Skin Problem     SUBJECTIVE:  Douglas Larson is a 37 y.o. male who presents with a possible abscess of his right ring finger for the past few days.  He reported he went to the hospital initially and was treated for worsening cellulitis to his right arm with Rocephin and vancomycin.  He was then discharged with linezolid 600 mg for 10 days course.  Reported he has 7 days remaining.  He denies similar symptoms in the past.  He denies any alleviating or aggravating factors.  He reports numbness around the abscess area.  Denies chills, fever, nausea, vomiting, diarrhea   ROS: As per HPI.  All other pertinent ROS negative.      Past Medical History:  Diagnosis Date   High cholesterol    Past Surgical History:  Procedure Laterality Date   REFRACTIVE SURGERY     Allergies  Allergen Reactions   Penicillins Dermatitis   Prednisone    Vancomycin Rash   No current facility-administered medications on file prior to encounter.   Current Outpatient Medications on File Prior to Encounter  Medication Sig Dispense Refill   linezolid (ZYVOX) 600 MG tablet Take 1 tablet (600 mg total) by mouth every 12 (twelve) hours. 18 tablet 0   fexofenadine-pseudoephedrine (ALLEGRA-D) 60-120 MG 12 hr tablet Take by mouth.     loratadine (CLARITIN) 10 MG tablet Take 1 tablet by mouth daily.     Social History   Socioeconomic History   Marital status: Single    Spouse name: Not on file   Number of children: Not on file   Years of education: Not on file   Highest education level: Not on file  Occupational History   Not on file  Tobacco Use   Smoking status: Never    Passive exposure: Yes   Smokeless tobacco: Never  Vaping Use   Vaping Use: Never used  Substance and Sexual Activity   Alcohol use: Not Currently   Drug use: Never   Sexual activity: Yes  Other Topics Concern   Not on file   Social History Narrative   Not on file   Social Determinants of Health   Financial Resource Strain: Not on file  Food Insecurity: No Food Insecurity (02/10/2023)   Hunger Vital Sign    Worried About Running Out of Food in the Last Year: Never true    Ran Out of Food in the Last Year: Never true  Transportation Needs: No Transportation Needs (02/10/2023)   PRAPARE - Administrator, Civil Service (Medical): No    Lack of Transportation (Non-Medical): No  Physical Activity: Not on file  Stress: Not on file  Social Connections: Not on file  Intimate Partner Violence: Not At Risk (02/10/2023)   Humiliation, Afraid, Rape, and Kick questionnaire    Fear of Current or Ex-Partner: No    Emotionally Abused: No    Physically Abused: No    Sexually Abused: No   History reviewed. No pertinent family history.  OBJECTIVE:  Vitals:   02/14/23 1242 02/14/23 1245  BP:  125/84  Pulse:  66  Resp:  18  Temp:  97.7 F (36.5 C)  TempSrc:  Oral  SpO2:  98%  Weight: 227 lb (103 kg)   Height: 5\' 11"  (1.803 m)      Physical Exam Vitals and nursing note reviewed.  Constitutional:  General: He is not in acute distress.    Appearance: Normal appearance. He is normal weight. He is not ill-appearing, toxic-appearing or diaphoretic.  Cardiovascular:     Rate and Rhythm: Normal rate and regular rhythm.     Pulses: Normal pulses.     Heart sounds: Normal heart sounds. No murmur heard.    No friction rub. No gallop.  Pulmonary:     Effort: Pulmonary effort is normal. No respiratory distress.     Breath sounds: Normal breath sounds. No stridor. No wheezing, rhonchi or rales.  Chest:     Chest wall: No tenderness.  Skin:    General: Skin is warm.     Comments: Right ring finger with sized abscess.  Site appears to be likely from a spider bite  Neurological:     Mental Status: He is alert and oriented to person, place, and time.      Procedure: Verbal consent obtained. Area  over induration cleaned with betadine.  The most fluctuant portion of the abscess was incised with a #11 blade scalpel. Abscess cavity explored and only drained blood.  Left open to air  with antibacterial ointment. Minimal bleeding. No complications.  ASSESSMENT & PLAN:  1. Abscess of finger of right hand     No orders of the defined types were placed in this encounter.  Discharge instructions  Keep it clean with warm water and soap Apply small amount of Neosporin or antibiotic ointment Continue to monitor for swelling, worsening pain, turning black or purple Continue to take antibiotic  as prescribed and to completion Follow-up with PCP Return sooner or go to the ED if you have any new or worsening symptoms such as increased redness, swelling, pain, nausea, vomiting, fever, chills, etc...   Reviewed expectations re: course of current medical issues. Questions answered. Outlined signs and symptoms indicating need for more acute intervention. Patient verbalized understanding. After Visit Summary given.           Durward Parcel, FNP 02/14/23 1342

## 2023-02-14 NOTE — ED Triage Notes (Addendum)
"  Last Friday started with finger swelling, ignored it then on Sunday hand had swollen, Urgent care treated with Pen V-K then had red streaks going up arm and sent to ED and admitted for treatment". Skin infection much improved "but now have pocket of infection on 4th digit right ring finger". No fever "but felt clammy last night and now having pain in that same hand/finger".

## 2024-03-23 ENCOUNTER — Ambulatory Visit
Admission: EM | Admit: 2024-03-23 | Discharge: 2024-03-23 | Disposition: A | Discharge status: Home / Self Care | Attending: Nurse Practitioner | Admitting: Nurse Practitioner

## 2024-03-23 DIAGNOSIS — M5410 Radiculopathy, site unspecified: Secondary | ICD-10-CM

## 2024-03-23 MED ORDER — TIZANIDINE HCL 4 MG PO TABS: 4.0000 mg | ORAL_TABLET | Freq: Three times a day (TID) | ORAL | 0 refills | Status: DC | PRN

## 2024-03-23 MED ORDER — METHYLPREDNISOLONE ACETATE 40 MG/ML IJ SUSP
40.0000 mg | Freq: Once | INTRAMUSCULAR | Status: AC
  Administered 2024-03-23: 40 mg via INTRAMUSCULAR

## 2024-03-23 MED ORDER — METHYLPREDNISOLONE 4 MG PO TBPK: ORAL_TABLET | ORAL | 0 refills | Status: DC

## 2024-03-23 NOTE — Discharge Instructions (Addendum)
 As we discussed, the pain in your left shoulder and arm appears to be coming from a pinched nerve.  We have given you an injection of steroid medicine today.  Please try taking the oral steroid tomorrow morning and take as prescribed.  Do not take any NSAIDs including Aleve while you are taking the steroid medicine because it can increase your risk of GI bleed.  You can take the tizanidine (muscle relaxant) every 8 hours as needed for muscular pain as well as Tylenol  500 to 1000 mg every 6 hours for pain.  Recommend follow-up with an orthopedic provider if symptoms do not improve with treatment for further evaluation and management.

## 2024-03-23 NOTE — ED Triage Notes (Signed)
 Pt reports his left shoulder, neck, and shoulder blade is painful and had some numbness x 1.5 weeks. States some radiates to his hand    Move a big AC one day before

## 2024-03-23 NOTE — ED Provider Notes (Signed)
 [facility]    CSN: [id number] Arrival date & time: 03/23/24  1559      History   Chief Complaint No chief complaint on file.   HPI Douglas Larson is a 38 y.o. male.   Patient presents today for 10-day history of left scapular pain that radiates up to his shoulder and down his left arm.  Reports intermittent numbness/tingling in the hand worse with certain movements of the arm and when he stands or walks.  He denies recent fall or trauma but the day before symptoms began, he did lift a heavy AC unit into a window.  Reports he had his 40 pound child jump on his left upper back the day after because he was having a muscle spasm in his back additionally.  He has been taking triple the daily dose of Aleve with minimal temporary improvement.  No bruising, swelling, or redness.  No fevers or nausea/vomiting.  No weakness of the left upper extremity.    Past Medical History:  Diagnosis Date   High cholesterol     Patient Active Problem List   Diagnosis Date Noted   Lymphangitis 02/10/2023   Cellulitis of left ring finger 02/10/2023   Cellulitis of right arm 02/10/2023   Cellulitis 02/09/2023   Constipation 05/03/2016   Allergic rhinitis 05/03/2016    Past Surgical History:  Procedure Laterality Date   REFRACTIVE SURGERY         Home Medications    Prior to Admission medications   Medication Sig Start Date End Date Taking? Authorizing Provider  methylPREDNISolone (MEDROL DOSEPAK) 4 MG TBPK tablet Use as directed on package. 03/23/24  Yes Chandra Harlene LABOR, NP  tiZANidine (ZANAFLEX) 4 MG tablet Take 1 tablet (4 mg total) by mouth every 8 (eight) hours as needed for muscle spasms. Do not take with alcohol or while driving or operating heavy machinery.  May cause drowsiness. 03/23/24  Yes Chandra Harlene LABOR, NP  fexofenadine-pseudoephedrine (ALLEGRA-D) 60-120 MG 12 hr tablet Take by mouth. 06/20/21   [provider]  linezolid  (ZYVOX ) 600 MG tablet Take 1  tablet (600 mg total) by mouth every 12 (twelve) hours. 02/11/23   Evonnie Lenis, MD  loratadine (CLARITIN) 10 MG tablet Take 1 tablet by mouth daily.    [provider]    Family History History reviewed. No pertinent family history.  Social History Social History   Tobacco Use   Smoking status: Never    Passive exposure: Yes   Smokeless tobacco: Never  Vaping Use   Vaping status: Never Used  Substance Use Topics   Alcohol use: Not Currently   Drug use: Never     Allergies   Penicillins, Prednisone, and Vancomycin    Review of Systems Review of Systems Per HPI  Physical Exam Triage Vital Signs ED Triage Vitals  Encounter Vitals Group     BP 03/23/24 1623 128/67     Girls Systolic BP Percentile --      Girls Diastolic BP Percentile --      Boys Systolic BP Percentile --      Boys Diastolic BP Percentile --      Pulse Rate 03/23/24 1623 73     Resp 03/23/24 1623 18     Temp 03/23/24 1623 (!) 97.5 F (36.4 C)     Temp src --      SpO2 03/23/24 1623 97 %     Weight --      Height --  Head Circumference --      Peak Flow --      Pain Score 03/23/24 1624 0     Pain Loc --      Pain Education --      Exclude from Growth Chart --    No data found.  Updated Vital Signs BP 128/67   Pulse 73   Temp (!) 97.5 F (36.4 C)   Resp 18   SpO2 97%   Visual Acuity Right Eye Distance:   Left Eye Distance:   Bilateral Distance:    Right Eye Near:   Left Eye Near:    Bilateral Near:     Physical Exam Vitals and nursing note reviewed.  Constitutional:      General: He is not in acute distress.    Appearance: Normal appearance. He is not toxic-appearing.  Pulmonary:     Effort: Pulmonary effort is normal. No respiratory distress.   Musculoskeletal:       Arms:     Comments: Inspection: no swelling, bruising, obvious deformity or redness to left scapula or left upper extremity Palpation: tender to palpation left scapula to area marked as well as  along ulnar nerve down to wrist; no obvious deformities palpated ROM: Full ROM to left shoulder, left upper extremity Strength: 5/5 bilateral upper extremities Neurovascular: neurovascularly intact in distal bilateral upper extremities; patient has decreased tactile sensation to the left arm medially; no change in color of the skin; radial pulses are equal and capillary refill is equal to distal fingertips bilaterally   Skin:    General: Skin is warm and dry.     Capillary Refill: Capillary refill takes less than 2 seconds.     Coloration: Skin is not jaundiced or pale.     Findings: No erythema.   Neurological:     Mental Status: He is alert and oriented to person, place, and time.   Psychiatric:        Behavior: Behavior is cooperative.      UC Treatments / Results  Labs (all labs ordered are listed, but only abnormal results are displayed) Labs Reviewed - No data to display  EKG   Radiology No results found.  Procedures Procedures (including critical care time)  Medications Ordered in UC Medications  methylPREDNISolone acetate (DEPO-MEDROL) injection 40 mg (40 mg Intramuscular Given 03/23/24 1705)    Initial Impression / Assessment and Plan / UC Course  I have reviewed the triage vital signs and the nursing notes.  Pertinent labs & imaging results that were available during my care of the patient were reviewed by me and considered in my medical decision making (see chart for details).   Patient is well-appearing, normotensive, afebrile, not tachycardic, not tachypneic, oxygenating well on room air.   1. Radiculopathy of arm No red flags Given no trauma prior to radicular pain starting, x-ray imaging deferred Suspect radicular pain due to nerve inflammation, pain treated with Depo-Medrol 40 mg IM in urgent care today, start oral Medrol dose pack tomorrow morning Avoid NSAIDs while on corticosteroids and start muscle laxer medication, Tylenol , range of motion and  stretching exercises Strict ER precautions discussed and recommended follow-up with Ortho if symptoms do not improve with full treatment and contact information provided  The patient was given the opportunity to ask questions.  All questions answered to their satisfaction.  The patient is in agreement to this plan.   Final Clinical Impressions(s) / UC Diagnoses   Final diagnoses:  Radiculopathy of arm  Discharge Instructions      As we discussed, the pain in your left shoulder and arm appears to be coming from a pinched nerve.  We have given you an injection of steroid medicine today.  Please try taking the oral steroid tomorrow morning and take as prescribed.  Do not take any NSAIDs including Aleve while you are taking the steroid medicine because it can increase your risk of GI bleed.  You can take the tizanidine (muscle relaxant) every 8 hours as needed for muscular pain as well as Tylenol  500 to 1000 mg every 6 hours for pain.  Recommend follow-up with an orthopedic provider if symptoms do not improve with treatment for further evaluation and management.    ED Prescriptions     Medication Sig Dispense Auth. Provider   methylPREDNISolone (MEDROL DOSEPAK) 4 MG TBPK tablet Use as directed on package. 21 tablet Chandra Raisin A, NP   tiZANidine (ZANAFLEX) 4 MG tablet Take 1 tablet (4 mg total) by mouth every 8 (eight) hours as needed for muscle spasms. Do not take with alcohol or while driving or operating heavy machinery.  May cause drowsiness. 30 tablet Chandra Raisin LABOR, NP      PDMP not reviewed this encounter.   Chandra Raisin LABOR, NP 03/23/24 [phone]

## 2024-03-27 ENCOUNTER — Other Ambulatory Visit (HOSPITAL_BASED_OUTPATIENT_CLINIC_OR_DEPARTMENT_OTHER): Payer: Self-pay

## 2024-03-27 ENCOUNTER — Ambulatory Visit (HOSPITAL_BASED_OUTPATIENT_CLINIC_OR_DEPARTMENT_OTHER): Admitting: Student

## 2024-03-27 ENCOUNTER — Encounter (HOSPITAL_BASED_OUTPATIENT_CLINIC_OR_DEPARTMENT_OTHER): Payer: Self-pay | Admitting: Student

## 2024-03-27 ENCOUNTER — Ambulatory Visit (HOSPITAL_BASED_OUTPATIENT_CLINIC_OR_DEPARTMENT_OTHER)

## 2024-03-27 DIAGNOSIS — M542 Cervicalgia: Secondary | ICD-10-CM

## 2024-03-27 DIAGNOSIS — M5412 Radiculopathy, cervical region: Secondary | ICD-10-CM

## 2024-03-27 DIAGNOSIS — M25512 Pain in left shoulder: Secondary | ICD-10-CM

## 2024-03-27 MED ORDER — METHOCARBAMOL 500 MG PO TABS
500.0000 mg | ORAL_TABLET | Freq: Four times a day (QID) | ORAL | 0 refills | Status: AC
  Filled 2024-03-27: qty 40, 10d supply, fill #0

## 2024-03-27 MED ORDER — MELOXICAM 15 MG PO TABS
15.0000 mg | ORAL_TABLET | Freq: Every day | ORAL | 0 refills | Status: AC
  Filled 2024-03-27: qty 10, 10d supply, fill #0

## 2024-03-27 NOTE — Progress Notes (Signed)
 Chief Complaint: Neck and left arm pain    Discussed the use of AI scribe software for clinical note transcription with the patient, who gave verbal consent to proceed.  History of Present Illness Douglas Larson is a 38 year old male who presents with left arm and shoulder pain, numbness, and tingling.  He has experienced these symptoms for two weeks, with numbness primarily affecting the index and ring fingers, occasionally spreading to the entire hand, especially when standing for long periods. The pain is throbbing and radiates from the shoulder to the forearm, becoming unbearable at times. The symptoms began after an episode of severe back pain extending from the neck to the shoulder. The pain initially started behind the shoulder blade and shoots down the arm with certain movements. Increased numbness and severe pain occurred during a trip involving prolonged walking.  He attempted to use a sling, which provided temporary relief but ultimately worsened the pain. He has been taking a muscle relaxer and a Medrol  pack for four days, initially providing relief, but symptoms have returned. The pain and numbness are more severe in the morning. He experiences shaking in his hand and difficulty moving his neck, particularly when looking left or right, with pain originating from the shoulder blade and radiating down the arm.   Surgical History:   None  PMH/PSH/Family History/Social History/Meds/Allergies:    Past Medical History:  Diagnosis Date   High cholesterol    Past Surgical History:  Procedure Laterality Date   REFRACTIVE SURGERY     Social History   Socioeconomic History   Marital status: Single    Spouse name: Not on file   Number of children: Not on file   Years of education: Not on file   Highest education level: Not on file  Occupational History   Not on file  Tobacco Use   Smoking status: Never    Passive exposure: Yes   Smokeless  tobacco: Never  Vaping Use   Vaping status: Never Used  Substance and Sexual Activity   Alcohol use: Not Currently   Drug use: Never   Sexual activity: Yes  Other Topics Concern   Not on file  Social History Narrative   Not on file   Social Drivers of Health   Financial Resource Strain: Not on file  Food Insecurity: No Food Insecurity (02/10/2023)   Hunger Vital Sign    Worried About Running Out of Food in the Last Year: Never true    Ran Out of Food in the Last Year: Never true  Transportation Needs: No Transportation Needs (02/10/2023)   PRAPARE - Administrator, Civil Service (Medical): No    Lack of Transportation (Non-Medical): No  Physical Activity: Not on file  Stress: Not on file  Social Connections: Not on file   History reviewed. No pertinent family history. Allergies  Allergen Reactions   Penicillins Dermatitis   Prednisone    Vancomycin  Rash   Current Outpatient Medications  Medication Sig Dispense Refill   meloxicam (MOBIC) 15 MG tablet Take 1 tablet (15 mg total) by mouth daily for 10 days. 10 tablet 0   methocarbamol (ROBAXIN) 500 MG tablet Take 1 tablet (500 mg total) by mouth 4 (four) times daily for 10 days. 40 tablet 0   fexofenadine-pseudoephedrine (ALLEGRA-D) 60-120 MG 12  hr tablet Take by mouth.     linezolid  (ZYVOX ) 600 MG tablet Take 1 tablet (600 mg total) by mouth every 12 (twelve) hours. 18 tablet 0   loratadine (CLARITIN) 10 MG tablet Take 1 tablet by mouth daily.     methylPREDNISolone  (MEDROL  DOSEPAK) 4 MG TBPK tablet Use as directed on package. 21 tablet 0   tiZANidine  (ZANAFLEX ) 4 MG tablet Take 1 tablet (4 mg total) by mouth every 8 (eight) hours as needed for muscle spasms. Do not take with alcohol or while driving or operating heavy machinery.  May cause drowsiness. 30 tablet 0   No current facility-administered medications for this visit.   No results found.  Review of Systems:   A ROS was performed including pertinent  positives and negatives as documented in the HPI.  Physical Exam :   Constitutional: NAD and appears stated age Neurological: Alert and oriented Psych: Appropriate affect and cooperative There were no vitals taken for this visit.   Comprehensive Musculoskeletal Exam:    Tenderness in the left cervical paraspinal musculature with positive Spurling's.  Cervical range of motion limited particularly with flexion and rotation/sidebending to the left.  Bilateral grip strength 5/5.  Full shoulder range of motion bilaterally to 160 degrees flexion, 40 degrees external rotation, and internal rotation to L2.  Imaging:   Xray (cervical spine 4 views): Normal alignment with overall well-maintained disc spacing, although mild narrowing is noted at the C6-C7 and C7-T1 levels.  Otherwise negative.  Xray (left shoulder 3 views): Negative for bony abnormality   I personally reviewed and interpreted the radiographs.      Assessment & Plan Cervical radiculopathy   He presents with cervical radiculopathy, exhibiting pain, paresthesias, numbness from the left shoulder into the left hand. Current treatment provides limited relief.  No red flag symptoms present.  Conservative management is preferred, anticipating improvement with physical therapy and medication. Finish the current steroid course.  Refer to physical therapy to eval and treat cervical radiculopathy, HEP exercises discussed in the meantime. Prescribe methocarbamol for muscle spasms and meloxicam for use after completing steroids. Recommend a cervical traction pillow for home use. Advise using heat or ice for muscle relief and instruct on gentle neck exercises, avoiding overexertion.     I personally saw and evaluated the patient, and participated in the management and treatment plan.  Leonce Reveal, PA-C Orthopedics

## 2024-05-20 ENCOUNTER — Other Ambulatory Visit: Payer: Self-pay | Admitting: Medical Genetics

## 2024-05-26 ENCOUNTER — Ambulatory Visit

## 2024-05-26 VITALS — BP 98/67 | HR 65 | Ht 71.0 in | Wt 221.0 lb

## 2024-05-26 DIAGNOSIS — Z0000 Encounter for general adult medical examination without abnormal findings: Secondary | ICD-10-CM | POA: Diagnosis not present

## 2024-05-26 NOTE — Progress Notes (Signed)
 New Patient Office Visit  Subjective    Patient ID: Douglas Larson, male    DOB: 02-18-1986  Age: 38 y.o. MRN: [id number]  CC:  Chief Complaint  Patient presents with   Establish Care    Establish care, and wantsa to set up an annual wellness visit with lab work    HPI Douglas Larson presents to establish care  Discussed the use of AI scribe software for clinical note transcription with the patient, who gave verbal consent to proceed.  History of Present Illness   Douglas Larson is a 38 year old male who presents for a preventative care visit.  Preventive health maintenance - No check-up in approximately two years due to COVID-19 disruptions and changes in previous primary care provider - Desires to resume annual preventive care visits  Hyperlipidemia - Previously elevated cholesterol levels - Achieved acceptable cholesterol levels within six months through dietary modifications - No lipid-lowering medications used  Blood pressure variability - Recent low blood pressure readings, possibly related to diet or measurement device - Initial low blood pressure followed by a reading over 160 mmHg on repeat measurement, attributed to cuff placement over elbow - Fasting most of the day and eating only dinner for weight management - Engages in regular walking and exercise  Shoulder pain and paresthesia - History of right shoulder pain following trauma from daughter jumping on him - Shoulder pain resolved after discontinuing prescribed medication early and resuming normal activities - Persistent numbness in two fingers despite resolution of shoulder pain  Cerumen impaction - Rapid accumulation of earwax - Uses hydrogen peroxide for ear cleaning, attempting to avoid overuse - Cleans ears when wax becomes irritating      Outpatient Encounter Medications as of 05/26/2024  Medication Sig   cetirizine (ZYRTEC) 10 MG tablet Take 10 mg by mouth daily.   fluticasone (FLONASE) 50 MCG/ACT  nasal spray Place 1 spray into both nostrils daily.   [DISCONTINUED] fexofenadine-pseudoephedrine (ALLEGRA-D) 60-120 MG 12 hr tablet Take by mouth. (Patient not taking: Reported on 05/26/2024)   [DISCONTINUED] linezolid  (ZYVOX ) 600 MG tablet Take 1 tablet (600 mg total) by mouth every 12 (twelve) hours. (Patient not taking: Reported on 05/26/2024)   [DISCONTINUED] loratadine (CLARITIN) 10 MG tablet Take 1 tablet by mouth daily. (Patient not taking: Reported on 05/26/2024)   [DISCONTINUED] methylPREDNISolone  (MEDROL  DOSEPAK) 4 MG TBPK tablet Use as directed on package. (Patient not taking: Reported on 05/26/2024)   [DISCONTINUED] tiZANidine  (ZANAFLEX ) 4 MG tablet Take 1 tablet (4 mg total) by mouth every 8 (eight) hours as needed for muscle spasms. Do not take with alcohol or while driving or operating heavy machinery.  May cause drowsiness. (Patient not taking: Reported on 05/26/2024)   No facility-administered encounter medications on file as of 05/26/2024.    Past Medical History:  Diagnosis Date   High cholesterol     Past Surgical History:  Procedure Laterality Date   REFRACTIVE SURGERY      History reviewed. No pertinent family history.  Social History   Socioeconomic History   Marital status: Single    Spouse name: Not on file   Number of children: Not on file   Years of education: Not on file   Highest education level: Not on file  Occupational History   Not on file  Tobacco Use   Smoking status: Never    Passive exposure: Yes   Smokeless tobacco: Never  Vaping Use   Vaping status: Never Used  Substance and Sexual Activity  Alcohol use: Not Currently   Drug use: Never   Sexual activity: Yes  Other Topics Concern   Not on file  Social History Narrative   Not on file   Social Drivers of Health   Financial Resource Strain: Not on file  Food Insecurity: No Food Insecurity (02/10/2023)   Hunger Vital Sign    Worried About Running Out of Food in the Last Year: Never  true    Ran Out of Food in the Last Year: Never true  Transportation Needs: No Transportation Needs (02/10/2023)   PRAPARE - Administrator, Civil Service (Medical): No    Lack of Transportation (Non-Medical): No  Physical Activity: Not on file  Stress: Not on file  Social Connections: Not on file  Intimate Partner Violence: Not At Risk (02/10/2023)   Humiliation, Afraid, Rape, and Kick questionnaire    Fear of Current or Ex-Partner: No    Emotionally Abused: No    Physically Abused: No    Sexually Abused: No    ROS      Objective    BP 98/67   Pulse 65   Ht 5' 11 (1.803 m)   Wt 221 lb 0.6 oz (100.3 kg)   SpO2 94%   BMI 30.83 kg/m   Physical Exam Vitals and nursing note reviewed.  Constitutional:      Appearance: Normal appearance.  HENT:     Head: Normocephalic.     Right Ear: Tympanic membrane, ear canal and external ear normal. There is impacted cerumen.     Left Ear: Tympanic membrane, ear canal and external ear normal. There is no impacted cerumen.     Nose: Nose normal.     Mouth/Throat:     Mouth: Mucous membranes are moist.     Pharynx: Oropharynx is clear.  Cardiovascular:     Rate and Rhythm: Normal rate and regular rhythm.  Pulmonary:     Effort: Pulmonary effort is normal.     Breath sounds: Normal breath sounds.  Musculoskeletal:     Cervical back: Normal range of motion and neck supple.  Skin:    General: Skin is warm and dry.  Neurological:     Mental Status: He is alert and oriented to person, place, and time.  Psychiatric:        Mood and Affect: Mood normal.        Thought Content: Thought content normal.         Assessment & Plan:   Problem List Items Addressed This Visit   None Visit Diagnoses       Encounter for preventative adult health care examination    -  Primary   Relevant Orders   CMP14+EGFR   Lipid Profile   TSH + free T4   HgB A1c   CBC   Hepatitis C Antibody   HIV antibody (with reflex)      Assessment and Plan    Annual wellness visit after a two-year gap. Blood pressure readings inconsistent, possibly due to diet or equipment error. Cholesterol managed through diet. Engages in fasting and regular exercise. Non-smoker, minimal alcohol consumption. - Recommend flu shot starting next month.  Cerumen impaction, self-managed Reports quick buildup of earwax, managed with hydrogen peroxide. Advised to avoid overuse of cleaning products. - Advise use of hydrogen peroxide once every week or two before showering.  Residual numbness in fingers after resolved shoulder pain Previous shoulder pain resolved. Residual numbness in two fingers persists, likely due to inflammation  or nerve irritation from previous shoulder injury. - Advise stretching exercises for shoulder, particularly pectoralis muscles.     Return in about 1 year (around 05/26/2025) for chronic follow-up with PCP.   Leita Longs, FNP

## 2024-05-27 LAB — CBC
Hematocrit: 46.8 % (ref 37.5–51.0)
Hemoglobin: 15.5 g/dL (ref 13.0–17.7)
MCH: 31.1 pg (ref 26.6–33.0)
MCHC: 33.1 g/dL (ref 31.5–35.7)
MCV: 94 fL (ref 79–97)
Platelets: 219 x10E3/uL (ref 150–450)
RBC: 4.98 x10E6/uL (ref 4.14–5.80)
RDW: 12.8 % (ref 11.6–15.4)
WBC: 5.2 x10E3/uL (ref 3.4–10.8)

## 2024-05-27 LAB — CMP14+EGFR
ALT: 24 IU/L (ref 0–44)
AST: 20 IU/L (ref 0–40)
Albumin: 4.7 g/dL (ref 4.1–5.1)
Alkaline Phosphatase: 109 IU/L (ref 44–121)
BUN/Creatinine Ratio: 9 (ref 9–20)
BUN: 11 mg/dL (ref 6–24)
Bilirubin Total: 0.5 mg/dL (ref 0.0–1.2)
CO2: 20 mmol/L (ref 20–29)
Calcium: 9.7 mg/dL (ref 8.7–10.2)
Chloride: 103 mmol/L (ref 96–106)
Creatinine, Ser: 1.17 mg/dL (ref 0.76–1.27)
Globulin, Total: 2.6 g/dL (ref 1.5–4.5)
Glucose: 87 mg/dL (ref 70–99)
Potassium: 4 mmol/L (ref 3.5–5.2)
Sodium: 141 mmol/L (ref 134–144)
Total Protein: 7.3 g/dL (ref 6.0–8.5)
eGFR: 81 mL/min/1.73 (ref >59)

## 2024-05-27 LAB — HEMOGLOBIN A1C
Est. average glucose Bld gHb Est-mCnc: 108 mg/dL
Hgb A1c MFr Bld: 5.4 % (ref 4.8–5.6)

## 2024-05-27 LAB — HEPATITIS C ANTIBODY: Hep C Virus Ab: NONREACTIVE

## 2024-05-27 LAB — LIPID PANEL
Chol/HDL Ratio: 5.6 ratio — ABNORMAL HIGH (ref 0.0–5.0)
Cholesterol, Total: 192 mg/dL (ref 100–199)
HDL: 34 mg/dL — ABNORMAL LOW (ref >39)
LDL Chol Calc (NIH): 136 mg/dL — ABNORMAL HIGH (ref 0–99)
Triglycerides: 120 mg/dL (ref 0–149)
VLDL Cholesterol Cal: 22 mg/dL (ref 5–40)

## 2024-05-27 LAB — TSH+FREE T4
Free T4: 1.2 ng/dL (ref 0.82–1.77)
TSH: 1.57 u[IU]/mL (ref 0.450–4.500)

## 2024-05-27 LAB — HIV ANTIBODY (ROUTINE TESTING W REFLEX): HIV Screen 4th Generation wRfx: NONREACTIVE

## 2024-07-11 ENCOUNTER — Other Ambulatory Visit: Payer: Self-pay | Admitting: Medical Genetics

## 2024-07-11 DIAGNOSIS — Z006 Encounter for examination for normal comparison and control in clinical research program: Secondary | ICD-10-CM

## 2024-07-15 ENCOUNTER — Ambulatory Visit: Payer: Self-pay

## 2024-07-31 ENCOUNTER — Encounter: Payer: Self-pay | Admitting: Radiology

## 2024-08-21 LAB — GENECONNECT MOLECULAR SCREEN: Genetic Analysis Overall Interpretation: NEGATIVE

## 2025-05-25 ENCOUNTER — Ambulatory Visit
